# Patient Record
Sex: Female | Born: 1960 | Race: White | Hispanic: No | Marital: Married | Smoking: Never smoker
Health system: Southern US, Community
[De-identification: ages and names within clinical notes are randomized; demographics above are authoritative.]

## PROBLEM LIST (undated history)

## (undated) DIAGNOSIS — F32A Depression, unspecified: Secondary | ICD-10-CM

## (undated) DIAGNOSIS — H269 Unspecified cataract: Secondary | ICD-10-CM

## (undated) DIAGNOSIS — I10 Essential (primary) hypertension: Secondary | ICD-10-CM

## (undated) DIAGNOSIS — E079 Disorder of thyroid, unspecified: Secondary | ICD-10-CM

## (undated) DIAGNOSIS — M1990 Unspecified osteoarthritis, unspecified site: Secondary | ICD-10-CM

## (undated) DIAGNOSIS — T7840XA Allergy, unspecified, initial encounter: Secondary | ICD-10-CM

## (undated) DIAGNOSIS — G4730 Sleep apnea, unspecified: Secondary | ICD-10-CM

## (undated) DIAGNOSIS — K219 Gastro-esophageal reflux disease without esophagitis: Secondary | ICD-10-CM

## (undated) DIAGNOSIS — E785 Hyperlipidemia, unspecified: Secondary | ICD-10-CM

## (undated) DIAGNOSIS — D649 Anemia, unspecified: Secondary | ICD-10-CM

## (undated) DIAGNOSIS — M5030 Other cervical disc degeneration, unspecified cervical region: Secondary | ICD-10-CM

## (undated) HISTORY — DX: Unspecified cataract: H26.9

## (undated) HISTORY — DX: Anemia, unspecified: D64.9

## (undated) HISTORY — DX: Allergy, unspecified, initial encounter: T78.40XA

## (undated) HISTORY — DX: Unspecified osteoarthritis, unspecified site: M19.90

## (undated) HISTORY — PX: WISDOM TOOTH EXTRACTION: SHX21

## (undated) HISTORY — DX: Disorder of thyroid, unspecified: E07.9

## (undated) HISTORY — DX: Sleep apnea, unspecified: G47.30

## (undated) HISTORY — DX: Gastro-esophageal reflux disease without esophagitis: K21.9

## (undated) HISTORY — DX: Other cervical disc degeneration, unspecified cervical region: M50.30

## (undated) HISTORY — DX: Hyperlipidemia, unspecified: E78.5

## (undated) HISTORY — PX: OTHER SURGICAL HISTORY: SHX169

## (undated) HISTORY — DX: Depression, unspecified: F32.A

---

## 1984-03-10 HISTORY — PX: GYNECOLOGIC CRYOSURGERY: SHX857

## 2012-07-17 DIAGNOSIS — M5030 Other cervical disc degeneration, unspecified cervical region: Secondary | ICD-10-CM | POA: Insufficient documentation

## 2016-10-09 ENCOUNTER — Emergency Department
Admission: EM | Admit: 2016-10-09 | Discharge: 2016-10-10 | Disposition: A | Discharge status: Home / Self Care | Payer: 59 | Attending: Emergency Medicine | Admitting: Emergency Medicine

## 2016-10-09 ENCOUNTER — Encounter: Payer: Self-pay | Admitting: *Deleted

## 2016-10-09 DIAGNOSIS — Y999 Unspecified external cause status: Secondary | ICD-10-CM | POA: Insufficient documentation

## 2016-10-09 DIAGNOSIS — R1011 Right upper quadrant pain: Secondary | ICD-10-CM | POA: Diagnosis not present

## 2016-10-09 DIAGNOSIS — M549 Dorsalgia, unspecified: Secondary | ICD-10-CM | POA: Insufficient documentation

## 2016-10-09 DIAGNOSIS — S0992XA Unspecified injury of nose, initial encounter: Secondary | ICD-10-CM | POA: Diagnosis present

## 2016-10-09 DIAGNOSIS — Y929 Unspecified place or not applicable: Secondary | ICD-10-CM | POA: Insufficient documentation

## 2016-10-09 DIAGNOSIS — Y939 Activity, unspecified: Secondary | ICD-10-CM | POA: Insufficient documentation

## 2016-10-09 DIAGNOSIS — S022XXA Fracture of nasal bones, initial encounter for closed fracture: Secondary | ICD-10-CM

## 2016-10-09 HISTORY — DX: Essential (primary) hypertension: I10
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
## 2016-10-10 ENCOUNTER — Emergency Department: Payer: 59

## 2016-10-10 ENCOUNTER — Encounter: Payer: Self-pay | Admitting: Radiology

## 2016-10-10 DIAGNOSIS — S022XXA Fracture of nasal bones, initial encounter for closed fracture: Secondary | ICD-10-CM | POA: Diagnosis not present

## 2016-10-10 LAB — COMPREHENSIVE METABOLIC PANEL
ALK PHOS: 90 U/L (ref 38–126)
ALT: 25 U/L (ref 14–54)
ANION GAP: 8 (ref 5–15)
AST: 29 U/L (ref 15–41)
Albumin: 3.9 g/dL (ref 3.5–5.0)
BILIRUBIN TOTAL: 0.3 mg/dL (ref 0.3–1.2)
BUN: 22 mg/dL — ABNORMAL HIGH (ref 6–20)
CALCIUM: 9 mg/dL (ref 8.9–10.3)
CO2: 28 mmol/L (ref 22–32)
Chloride: 101 mmol/L (ref 101–111)
Creatinine, Ser: 1.02 mg/dL — ABNORMAL HIGH (ref 0.44–1.00)
GFR calc non Af Amer: >60 mL/min (ref >60)
Glucose, Bld: 104 mg/dL — ABNORMAL HIGH (ref 65–99)
Potassium: 3.4 mmol/L — ABNORMAL LOW (ref 3.5–5.1)
Sodium: 137 mmol/L (ref 135–145)
TOTAL PROTEIN: 7.5 g/dL (ref 6.5–8.1)

## 2016-10-10 LAB — CBC
HCT: 35.6 % (ref 35.0–47.0)
HEMOGLOBIN: 12.2 g/dL (ref 12.0–16.0)
MCH: 29.8 pg (ref 26.0–34.0)
MCHC: 34.2 g/dL (ref 32.0–36.0)
MCV: 87.1 fL (ref 80.0–100.0)
PLATELETS: 251 10*3/uL (ref 150–440)
RBC: 4.09 MIL/uL (ref 3.80–5.20)
RDW: 12.9 % (ref 11.5–14.5)
WBC: 12.2 10*3/uL — ABNORMAL HIGH (ref 3.6–11.0)

## 2016-10-10 MED ORDER — IOPAMIDOL (ISOVUE-300) INJECTION 61%
100.0000 mL | Freq: Once | INTRAVENOUS | Status: AC | PRN
  Administered 2016-10-10: 100 mL via INTRAVENOUS

## 2016-10-10 MED ORDER — OXYCODONE-ACETAMINOPHEN 5-325 MG PO TABS: 1.0000 | ORAL_TABLET | ORAL | 0 refills | Status: DC | PRN

## 2016-10-10 MED ORDER — OXYCODONE-ACETAMINOPHEN 5-325 MG PO TABS
1.0000 | ORAL_TABLET | Freq: Once | ORAL | Status: AC
  Administered 2016-10-10: 1 via ORAL
  Filled 2016-10-10: qty 1

## 2016-10-10 NOTE — ED Notes (Signed)
MD verbal order to scan pt without blood wok resulting.

## 2016-10-10 NOTE — ED Notes (Signed)
Pt updated on treatment plan. Father at bedside. NAD noted at this time. Bleeding remains under control.

## 2016-10-10 NOTE — ED Notes (Signed)
Patient transported to CT 
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
## 2020-07-12 DIAGNOSIS — Z800 Family history of malignant neoplasm of digestive organs: Secondary | ICD-10-CM | POA: Insufficient documentation

## 2020-09-24 DIAGNOSIS — S83242A Other tear of medial meniscus, current injury, left knee, initial encounter: Secondary | ICD-10-CM | POA: Insufficient documentation

## 2020-12-29 DIAGNOSIS — M1990 Unspecified osteoarthritis, unspecified site: Secondary | ICD-10-CM | POA: Insufficient documentation

## 2021-01-04 DIAGNOSIS — E034 Atrophy of thyroid (acquired): Secondary | ICD-10-CM | POA: Insufficient documentation

## 2021-01-04 DIAGNOSIS — Z803 Family history of malignant neoplasm of breast: Secondary | ICD-10-CM | POA: Insufficient documentation

## 2021-01-04 DIAGNOSIS — K219 Gastro-esophageal reflux disease without esophagitis: Secondary | ICD-10-CM | POA: Insufficient documentation

## 2021-01-04 DIAGNOSIS — J40 Bronchitis, not specified as acute or chronic: Secondary | ICD-10-CM | POA: Insufficient documentation

## 2021-01-04 DIAGNOSIS — N393 Stress incontinence (female) (male): Secondary | ICD-10-CM | POA: Insufficient documentation

## 2021-01-04 DIAGNOSIS — M170 Bilateral primary osteoarthritis of knee: Secondary | ICD-10-CM | POA: Insufficient documentation

## 2021-01-04 DIAGNOSIS — F330 Major depressive disorder, recurrent, mild: Secondary | ICD-10-CM | POA: Insufficient documentation

## 2021-09-22 ENCOUNTER — Encounter: Payer: Self-pay | Admitting: Internal Medicine

## 2021-10-15 ENCOUNTER — Other Ambulatory Visit: Payer: Self-pay

## 2021-10-15 MED ORDER — DULOXETINE HCL 60 MG PO CPEP
ORAL_CAPSULE | ORAL | 0 refills | Status: DC
  Filled 2021-10-15: qty 90, 90d supply, fill #0

## 2021-10-22 ENCOUNTER — Ambulatory Visit: Payer: 59 | Admitting: Internal Medicine

## 2021-10-29 ENCOUNTER — Ambulatory Visit: Payer: 59 | Admitting: Internal Medicine

## 2021-11-07 ENCOUNTER — Ambulatory Visit (INDEPENDENT_AMBULATORY_CARE_PROVIDER_SITE_OTHER): Payer: 59 | Admitting: Nurse Practitioner

## 2021-11-07 ENCOUNTER — Encounter: Payer: Self-pay | Admitting: Nurse Practitioner

## 2021-11-07 VITALS — BP 99/64 | HR 71 | Ht 60.0 in | Wt 208.2 lb

## 2021-11-07 DIAGNOSIS — F32A Depression, unspecified: Secondary | ICD-10-CM | POA: Diagnosis not present

## 2021-11-07 DIAGNOSIS — I10 Essential (primary) hypertension: Secondary | ICD-10-CM

## 2021-11-07 DIAGNOSIS — E039 Hypothyroidism, unspecified: Secondary | ICD-10-CM | POA: Diagnosis not present

## 2021-11-07 DIAGNOSIS — G4730 Sleep apnea, unspecified: Secondary | ICD-10-CM | POA: Diagnosis not present

## 2021-11-07 DIAGNOSIS — Z0000 Encounter for general adult medical examination without abnormal findings: Secondary | ICD-10-CM | POA: Diagnosis not present

## 2021-11-07 MED ORDER — LEVOTHYROXINE SODIUM 25 MCG PO TABS: 25.0000 ug | ORAL_TABLET | Freq: Every day | ORAL | 2 refills | Status: DC

## 2021-11-07 MED ORDER — LISINOPRIL-HYDROCHLOROTHIAZIDE 20-12.5 MG PO TABS
1.0000 | ORAL_TABLET | Freq: Every day | ORAL | 2 refills | Status: DC
  Filled 2021-12-11: qty 90, 90d supply, fill #0
  Filled 2022-03-17: qty 90, 90d supply, fill #1
  Filled 2022-06-09: qty 60, 60d supply, fill #2

## 2021-11-07 NOTE — Assessment & Plan Note (Signed)
Body mass index is 40.67 kg/m. Advised pt to lose weight. Advised patient to avoid trans fat, fatty and fried food. Follow a regular physical activity schedule.

## 2021-11-07 NOTE — Assessment & Plan Note (Signed)
Patient blood pressure controlled. Advised patient to eat low-salt heart healthy diet. Encourage patient to continue lisinopril HCTZ.

## 2021-11-07 NOTE — Assessment & Plan Note (Signed)
Patient have history of sleep apnea and is using CPAP machine from last 10 years. Patient would like to get a sleep study done.

## 2021-11-07 NOTE — Progress Notes (Signed)
New Patient Office Visit  Subjective    Patient ID: Alicia Mcgrath, female    DOB: [birth year]  Age: 61 y.o. MRN: [id number]  CC: No chief complaint on file.   HPI Alicia Mcgrath presents to establish care and annual physical.. She has h/o hypertension, hypothyroidism, GERD, DDD cervical depression.   Flu: 2022 Tetanus:needs to update  COVID: pfizerx2 Pap smear: due Dentist: due  Mammogram: due Eye examination: due  Exercise: intermittent 15 mints walking   Diet: Patient does eat meat. Patient consumes fruits and veggies. Patient eat some fried food. Patient drinks water, coffee and tea.    Outpatient Encounter Medications as of 11/07/2021  Medication Sig   levothyroxine (SYNTHROID) 25 MCG tablet Take 1 tablet (25 mcg total) by mouth daily.   lisinopril-hydrochlorothiazide (ZESTORETIC) 20-12.5 MG tablet Take 1 tablet by mouth daily.   DULoxetine (CYMBALTA) 60 MG capsule Take 1 capsule every day by oral route.   oxyCODONE-acetaminophen (ROXICET) 5-325 MG tablet Take 1 tablet by mouth every 4 (four) hours as needed for severe pain.   No facility-administered encounter medications on file as of 11/07/2021.    Past Medical History:  Diagnosis Date   Hypertension     History reviewed. No pertinent surgical history.  History reviewed. No pertinent family history.  Social History   Socioeconomic History   Marital status: Married    Spouse name: Not on file   Number of children: Not on file   Years of education: Not on file   Highest education level: Not on file  Occupational History   Not on file  Tobacco Use   Smoking status: Never   Smokeless tobacco: Never  Substance and Sexual Activity   Alcohol use: No   Drug use: No   Sexual activity: Not on file  Other Topics Concern   Not on file  Social History Narrative   Not on file   Social Determinants of Health   Financial Resource Strain: Not on file  Food Insecurity: Not on file  Transportation Needs:  Not on file  Physical Activity: Not on file  Stress: Not on file  Social Connections: Not on file  Intimate Partner Violence: Not on file    Review of Systems  Constitutional: Negative.   HENT: Negative.    Eyes: Negative.   Respiratory:  Negative for cough and shortness of breath.   Cardiovascular:  Negative for chest pain and leg swelling.  Genitourinary:        Stress incontinence   Musculoskeletal:  Positive for joint pain and neck pain.  Skin: Negative.   Neurological:  Positive for tingling. Negative for dizziness and headaches.  Psychiatric/Behavioral:  Negative for depression, substance abuse and suicidal ideas.         Objective    BP 99/64   Pulse 71   Ht 5' (1.524 m)   Wt 208 lb 4 oz (94.5 kg)   BMI 40.67 kg/m   Physical Exam Constitutional:      Appearance: Normal appearance. She is obese.  HENT:     Right Ear: Tympanic membrane normal.     Left Ear: Tympanic membrane normal.     Nose: Nose normal.  Eyes:     Extraocular Movements: Extraocular movements intact.     Conjunctiva/sclera: Conjunctivae normal.     Pupils: Pupils are equal, round, and reactive to light.  Cardiovascular:     Rate and Rhythm: Normal rate and regular rhythm.     Pulses: Normal pulses.  Heart sounds: Normal heart sounds.  Pulmonary:     Effort: Pulmonary effort is normal.     Breath sounds: Normal breath sounds.  Abdominal:     General: Bowel sounds are normal.     Palpations: Abdomen is soft. There is no mass.     Tenderness: There is no abdominal tenderness.     Hernia: No hernia is present.  Skin:    General: Skin is warm.     Capillary Refill: Capillary refill takes less than 2 seconds.  Neurological:     General: No focal deficit present.     Mental Status: She is alert and oriented to person, place, and time. Mental status is at baseline.  Psychiatric:        Mood and Affect: Mood normal.        Behavior: Behavior normal.        Thought Content: Thought  content normal.        Judgment: Judgment normal.         Assessment & Plan:   Problem List Items Addressed This Visit       Cardiovascular and Mediastinum   Hypertension    Patient blood pressure controlled. Advised patient to eat low-salt heart healthy diet. Encourage patient to continue lisinopril HCTZ.      Relevant Medications   lisinopril-hydrochlorothiazide (ZESTORETIC) 20-12.5 MG tablet     Respiratory   Sleep apnea    Patient have history of sleep apnea and is using CPAP machine from last 10 years. Patient would like to get a sleep study done.        Endocrine   Hypothyroidism    Patient is taking levothyroxine 25 mcg daily. Continue levothyroxine.       Relevant Medications   levothyroxine (SYNTHROID) 25 MCG tablet     Other   Annual physical exam - Primary    Encouraged patient to consume a balanced diet and regular exercise regimen. Advised to see an eye doctor and dentist annually.  Referral sent for mammogram and colonoscopy.      Depression    Stable with medication. Continue Cymbalta 60 mg daily.      Morbid obesity (HCC)    Body mass index is 40.67 kg/m. Advised pt to lose weight. Advised patient to avoid trans fat, fatty and fried food. Follow a regular physical activity schedule.           Return in about 1 month (around 12/07/2021).   Alicia Dies, NP

## 2021-11-07 NOTE — Assessment & Plan Note (Signed)
Patient is taking levothyroxine 25 mcg daily. Continue levothyroxine.

## 2021-11-07 NOTE — Assessment & Plan Note (Signed)
Stable with medication. Continue Cymbalta 60 mg daily.

## 2021-11-07 NOTE — Assessment & Plan Note (Signed)
Encouraged patient to consume a balanced diet and regular exercise regimen. Advised to see an eye doctor and dentist annually.  Referral sent for mammogram and colonoscopy.

## 2021-12-11 ENCOUNTER — Other Ambulatory Visit: Payer: Self-pay

## 2021-12-11 MED ORDER — LEVOTHYROXINE SODIUM 25 MCG PO TABS
25.0000 ug | ORAL_TABLET | Freq: Every day | ORAL | 1 refills | Status: DC
  Filled 2021-12-11: qty 90, 90d supply, fill #0

## 2021-12-12 ENCOUNTER — Other Ambulatory Visit: Payer: Self-pay

## 2021-12-17 ENCOUNTER — Other Ambulatory Visit: Payer: Self-pay

## 2021-12-17 MED ORDER — DULOXETINE HCL 20 MG PO CPEP
40.0000 mg | ORAL_CAPSULE | Freq: Every day | ORAL | 0 refills | Status: DC
  Filled 2021-12-17: qty 180, 90d supply, fill #0

## 2021-12-22 ENCOUNTER — Encounter: Payer: Self-pay | Admitting: Internal Medicine

## 2022-01-22 ENCOUNTER — Ambulatory Visit: Payer: 59 | Admitting: Internal Medicine

## 2022-04-10 ENCOUNTER — Other Ambulatory Visit: Payer: Self-pay

## 2022-04-10 ENCOUNTER — Ambulatory Visit: Payer: Commercial Managed Care - PPO | Admitting: Family Medicine

## 2022-04-10 ENCOUNTER — Encounter: Payer: Self-pay | Admitting: Family Medicine

## 2022-04-10 VITALS — BP 115/80 | HR 65 | Temp 97.7°F | Ht 61.0 in | Wt 204.0 lb

## 2022-04-10 DIAGNOSIS — E66812 Obesity, class 2: Secondary | ICD-10-CM

## 2022-04-10 DIAGNOSIS — G4733 Obstructive sleep apnea (adult) (pediatric): Secondary | ICD-10-CM | POA: Diagnosis not present

## 2022-04-10 DIAGNOSIS — Z1231 Encounter for screening mammogram for malignant neoplasm of breast: Secondary | ICD-10-CM

## 2022-04-10 DIAGNOSIS — Z23 Encounter for immunization: Secondary | ICD-10-CM

## 2022-04-10 DIAGNOSIS — Z1159 Encounter for screening for other viral diseases: Secondary | ICD-10-CM | POA: Diagnosis not present

## 2022-04-10 DIAGNOSIS — Z1211 Encounter for screening for malignant neoplasm of colon: Secondary | ICD-10-CM

## 2022-04-10 DIAGNOSIS — E038 Other specified hypothyroidism: Secondary | ICD-10-CM | POA: Diagnosis not present

## 2022-04-10 DIAGNOSIS — I10 Essential (primary) hypertension: Secondary | ICD-10-CM | POA: Diagnosis not present

## 2022-04-10 DIAGNOSIS — Z6838 Body mass index (BMI) 38.0-38.9, adult: Secondary | ICD-10-CM | POA: Diagnosis not present

## 2022-04-10 DIAGNOSIS — Z7689 Persons encountering health services in other specified circumstances: Secondary | ICD-10-CM | POA: Insufficient documentation

## 2022-04-10 LAB — CBC WITH DIFFERENTIAL/PLATELET
Basophils Absolute: 107 cells/uL (ref 0–200)
Eosinophils Absolute: 287 cells/uL (ref 15–500)
Eosinophils Relative: 3.5 %
HCT: 38.3 % (ref 35.0–45.0)
MPV: 9.6 fL (ref 7.5–12.5)
Monocytes Relative: 4.4 %
Neutrophils Relative %: 58.8 %
Platelets: 261 10*3/uL (ref 140–400)
WBC: 8.2 10*3/uL (ref 3.8–10.8)

## 2022-04-10 MED ORDER — LEVOTHYROXINE SODIUM 25 MCG PO TABS
25.0000 ug | ORAL_TABLET | Freq: Every day | ORAL | 1 refills | Status: DC
  Filled 2022-04-10: qty 90, 90d supply, fill #0
  Filled 2022-07-04 – 2022-07-08 (×2): qty 90, 90d supply, fill #1

## 2022-04-10 NOTE — Assessment & Plan Note (Signed)
Chronic. Well controlled on current regimen. Will continue Zestoretic 20-12.5mg  daily. Seek medical care for chest pain, palpitations, shortness of breath, lightheadedness/dizziness, swelling of extremities.

## 2022-04-10 NOTE — Assessment & Plan Note (Signed)
Medical history reviewed today. She will return to office in August for annual physical. Fasting labs drawn today. She has no concerns today and her chronic conditions are currently well controlled. Will continue medications and refill Synthroid as requested. Referral place for CPAP titration. Health maintenance items reviewed and orders placed for mammogram, colonoscopy. TDap done today. She is working on weight loss currently with diet and exercise.

## 2022-04-10 NOTE — Progress Notes (Signed)
New Patient Office Visit  Subjective    Patient ID: Alicia Mcgrath, female    DOB: 19-Dec-1960  Age: 62 y.o. MRN: [id number]  CC: No chief complaint on file.   HPI [name] presents to establish care. Oriented to practice routines and expectations. Her most recent physical was in August 2023 without labs. She has a PMH of OSA and was seeing a pulmonologist for this, depression, hypothyroidism, HTN, GERD, DDD- all well controlled on current regimen. She requests refill on her Synthroid and CPAP titration. No concerns today. Limited medical records available for review.  Cervical cancer screening: due, will complete with physical in August Breast cancer screening: due Colon cancer screening: colonoscopy q5y, due Vaccines: Tdap today Tobacco/ETOH: denies BMI 38.55   Outpatient Encounter Medications as of 04/10/2022  Medication Sig   DULoxetine (CYMBALTA) 20 MG capsule Take 2 capsules (40 mg total) by mouth daily.   lisinopril-hydrochlorothiazide (ZESTORETIC) 20-12.5 MG tablet Take 1 tablet by mouth daily.   [DISCONTINUED] DULoxetine (CYMBALTA) 60 MG capsule Take 1 capsule every day by oral route.   [DISCONTINUED] levothyroxine (SYNTHROID) 25 MCG tablet Take 1 tablet (25 mcg total) by mouth daily.   levothyroxine (SYNTHROID) 25 MCG tablet Take 1 tablet (25 mcg total) by mouth daily.   [DISCONTINUED] levothyroxine (SYNTHROID) 25 MCG tablet Take 1 tablet (25 mcg total) by mouth daily.   No facility-administered encounter medications on file as of 04/10/2022.    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    Family History  Problem Relation Age of Onset   Cancer Mother    Heart disease  Father    Stroke Father    Cancer Father    Stroke Maternal Grandmother    Diabetes Maternal Grandmother    Cancer Maternal Grandfather    Diabetes Maternal Grandfather    Diabetes Paternal Grandfather    Alcohol abuse Paternal Grandfather     Social History   Socioeconomic History   Marital status: Married    Spouse name: Not on file   Number of children: Not on file   Years of education: Not on file   Highest education level: Not on file  Occupational History   Not on file  Tobacco Use   Smoking status: Never   Smokeless tobacco: Never  Substance and Sexual Activity   Alcohol use: No   Drug use: No   Sexual activity: Not Currently  Other Topics Concern   Not on file  Social History Narrative   Not on file   Social Determinants of Health   Financial Resource Strain: Not on file  Food Insecurity: Not on file  Transportation Needs: Not on file  Physical Activity: Not on file  Stress: Not on file  Social Connections: Not on file  Intimate Partner Violence: Not on file    Review of Systems  Constitutional: Negative.   HENT: Negative.    Eyes: Negative.   Respiratory: Negative.    Cardiovascular: Negative.   Gastrointestinal: Negative.   Genitourinary: Negative.   Musculoskeletal: Negative.   Skin: Negative.   Neurological: Negative.   Endo/Heme/Allergies: Negative.   Psychiatric/Behavioral: Negative.  All other systems reviewed and are negative.       Objective    BP 115/80   Pulse 65   Temp 97.7 F (36.5 C) (Oral)   Ht 5\' 1"  (1.549 m)   Wt 204 lb (92.5 kg)   SpO2 100%   BMI 38.55 kg/m   Physical Exam Vitals and nursing note reviewed.  Constitutional:      Appearance: Normal appearance. She is obese.  HENT:     Head: Normocephalic and atraumatic.     Right Ear: Tympanic membrane, ear canal and external ear normal.     Left Ear: Tympanic membrane, ear canal and external ear normal.     Nose: Nose normal.     Mouth/Throat:     Mouth:  Mucous membranes are moist.     Pharynx: Oropharynx is clear.  Eyes:     Extraocular Movements: Extraocular movements intact.     Conjunctiva/sclera: Conjunctivae normal.     Pupils: Pupils are equal, round, and reactive to light.  Cardiovascular:     Rate and Rhythm: Normal rate and regular rhythm.     Pulses: Normal pulses.     Heart sounds: Normal heart sounds.  Pulmonary:     Effort: Pulmonary effort is normal.     Breath sounds: Normal breath sounds.  Abdominal:     General: Bowel sounds are normal.     Palpations: Abdomen is soft.  Musculoskeletal:        General: Normal range of motion.     Cervical back: Normal range of motion and neck supple.  Skin:    General: Skin is warm and dry.     Capillary Refill: Capillary refill takes less than 2 seconds.  Neurological:     General: No focal deficit present.     Mental Status: She is alert and oriented to person, place, and time. Mental status is at baseline.  Psychiatric:        Mood and Affect: Mood normal.        Behavior: Behavior normal.        Thought Content: Thought content normal.        Judgment: Judgment normal.         Assessment & Plan:   Problem List Items Addressed This Visit       Cardiovascular and Mediastinum   Hypertension    Chronic. Well controlled on current regimen. Will continue Zestoretic 20-12.5mg  daily. Seek medical care for chest pain, palpitations, shortness of breath, lightheadedness/dizziness, swelling of extremities.      Relevant Orders   CBC with Differential/Platelet   COMPLETE METABOLIC PANEL WITH GFR   Lipid panel     Respiratory   Sleep apnea   Relevant Orders   Cpap titration     Endocrine   Subclinical hypothyroidism   Relevant Medications   levothyroxine (SYNTHROID) 25 MCG tablet   Other Relevant Orders   TSH     Other   Encounter to establish care with new doctor - Primary    Medical history reviewed today. She will return to office in August for annual  physical. Fasting labs drawn today. She has no concerns today and her chronic conditions are currently well controlled. Will continue medications and refill Synthroid as requested. Referral place for CPAP titration. Health maintenance items reviewed and orders placed for mammogram, colonoscopy. TDap done today. She is working on weight loss currently with diet and exercise.      Other Visit Diagnoses  Encounter for screening mammogram for malignant neoplasm of breast       Relevant Orders   MM DIGITAL SCREENING BILATERAL   Colon cancer screening       Relevant Orders   Ambulatory referral to Gastroenterology   Need for hepatitis C screening test       Relevant Orders   Hepatitis C antibody   Class 2 severe obesity with serious comorbidity and body mass index (BMI) of 38.0 to 38.9 in adult, unspecified obesity type ([place])           Return for annual physical.   Rubie Maid, FNP

## 2022-04-11 ENCOUNTER — Other Ambulatory Visit (INDEPENDENT_AMBULATORY_CARE_PROVIDER_SITE_OTHER): Payer: Commercial Managed Care - PPO

## 2022-04-11 DIAGNOSIS — Z23 Encounter for immunization: Secondary | ICD-10-CM

## 2022-04-11 LAB — LIPID PANEL
Cholesterol: 184 mg/dL (ref <200)
HDL: 52 mg/dL (ref >=50)
LDL Cholesterol (Calc): 92 mg/dL (calc)
Non-HDL Cholesterol (Calc): 132 mg/dL (calc) — ABNORMAL HIGH (ref <130)
Total CHOL/HDL Ratio: 3.5 (calc) (ref <5.0)
Triglycerides: 277 mg/dL — ABNORMAL HIGH (ref <150)

## 2022-04-11 LAB — CBC WITH DIFFERENTIAL/PLATELET
Absolute Monocytes: 361 cells/uL (ref 200–950)
Basophils Relative: 1.3 %
Hemoglobin: 13.3 g/dL (ref 11.7–15.5)
Lymphs Abs: 2624 cells/uL (ref 850–3900)
MCH: 31.1 pg (ref 27.0–33.0)
MCHC: 34.7 g/dL (ref 32.0–36.0)
MCV: 89.7 fL (ref 80.0–100.0)
Neutro Abs: 4822 cells/uL (ref 1500–7800)
RBC: 4.27 10*6/uL (ref 3.80–5.10)
RDW: 12.3 % (ref 11.0–15.0)
Total Lymphocyte: 32 %

## 2022-04-11 LAB — COMPLETE METABOLIC PANEL WITH GFR
AG Ratio: 1.4 (calc) (ref 1.0–2.5)
ALT: 29 U/L (ref 6–29)
AST: 26 U/L (ref 10–35)
Albumin: 4.2 g/dL (ref 3.6–5.1)
Alkaline phosphatase (APISO): 77 U/L (ref 37–153)
BUN: 18 mg/dL (ref 7–25)
CO2: 32 mmol/L (ref 20–32)
Calcium: 9.7 mg/dL (ref 8.6–10.4)
Chloride: 102 mmol/L (ref 98–110)
Creat: 0.93 mg/dL (ref 0.50–1.05)
Globulin: 2.9 g/dL (calc) (ref 1.9–3.7)
Glucose, Bld: 92 mg/dL (ref 65–99)
Potassium: 4.2 mmol/L (ref 3.5–5.3)
Sodium: 141 mmol/L (ref 135–146)
Total Bilirubin: 0.3 mg/dL (ref 0.2–1.2)
Total Protein: 7.1 g/dL (ref 6.1–8.1)
eGFR: 70 mL/min/{1.73_m2} (ref >=60)

## 2022-04-11 LAB — HEPATITIS C ANTIBODY: Hepatitis C Ab: NONREACTIVE

## 2022-04-11 LAB — TSH: TSH: 3.08 mIU/L (ref 0.40–4.50)

## 2022-04-15 ENCOUNTER — Encounter: Payer: Self-pay | Admitting: Gastroenterology

## 2022-04-16 ENCOUNTER — Other Ambulatory Visit: Payer: Self-pay

## 2022-04-17 ENCOUNTER — Other Ambulatory Visit: Payer: Self-pay

## 2022-04-18 ENCOUNTER — Other Ambulatory Visit: Payer: Self-pay

## 2022-04-22 ENCOUNTER — Encounter: Payer: Self-pay | Admitting: Family Medicine

## 2022-04-23 ENCOUNTER — Other Ambulatory Visit: Payer: Self-pay

## 2022-04-23 ENCOUNTER — Other Ambulatory Visit: Payer: Self-pay | Admitting: Family Medicine

## 2022-04-23 MED ORDER — DULOXETINE HCL 20 MG PO CPEP
40.0000 mg | ORAL_CAPSULE | Freq: Every day | ORAL | 0 refills | Status: DC
  Filled 2022-04-23: qty 180, 90d supply, fill #0

## 2022-05-07 ENCOUNTER — Ambulatory Visit (AMBULATORY_SURGERY_CENTER): Payer: Commercial Managed Care - PPO | Admitting: *Deleted

## 2022-05-07 ENCOUNTER — Other Ambulatory Visit: Payer: Self-pay

## 2022-05-07 VITALS — Ht 60.0 in | Wt 194.0 lb

## 2022-05-07 DIAGNOSIS — Z1211 Encounter for screening for malignant neoplasm of colon: Secondary | ICD-10-CM

## 2022-05-07 MED ORDER — NA SULFATE-K SULFATE-MG SULF 17.5-3.13-1.6 GM/177ML PO SOLN
1.0000 | Freq: Once | ORAL | 0 refills | Status: AC
  Filled 2022-05-07: qty 354, 1d supply, fill #0

## 2022-05-07 NOTE — Progress Notes (Signed)
No egg or soy allergy known to patient  No issues known to pt with past sedation with any surgeries or procedures Patient denies ever being told they had issues or difficulty with intubation  No FH of Malignant Hyperthermia Pt is not on diet pills Pt is not on  home 02  Pt is not on blood thinners  Pt denies issues with constipation  No A fib or A flutter Have any cardiac testing pending--no Pt instructed to use Singlecare.com or GoodRx for a price reduction on prep  Patient's chart reviewed by Alicia Mcgrath CNRA prior to previsit and patient appropriate for the [facility].  Previsit completed and red dot placed by patient's name on their procedure day (on provider's schedule).    

## 2022-05-08 ENCOUNTER — Encounter: Payer: Self-pay | Admitting: Gastroenterology

## 2022-05-28 ENCOUNTER — Ambulatory Visit (AMBULATORY_SURGERY_CENTER): Payer: Commercial Managed Care - PPO | Admitting: Gastroenterology

## 2022-05-28 ENCOUNTER — Encounter: Payer: Self-pay | Admitting: Gastroenterology

## 2022-05-28 VITALS — BP 97/57 | HR 59 | Temp 97.3°F | Resp 12 | Ht 60.0 in | Wt 194.0 lb

## 2022-05-28 DIAGNOSIS — Z1211 Encounter for screening for malignant neoplasm of colon: Secondary | ICD-10-CM | POA: Diagnosis not present

## 2022-05-28 DIAGNOSIS — D120 Benign neoplasm of cecum: Secondary | ICD-10-CM | POA: Diagnosis not present

## 2022-05-28 MED ORDER — SODIUM CHLORIDE 0.9 % IV SOLN: 500.0000 mL | Freq: Once | INTRAVENOUS | Status: DC

## 2022-05-28 NOTE — Progress Notes (Signed)
Uneventful anesthetic. Report to pacu rn. Vss. Care resumed by rn. 

## 2022-05-28 NOTE — Patient Instructions (Signed)
Handouts Provided:  Polyps  YOU HAD AN ENDOSCOPIC PROCEDURE TODAY AT THE [facility]:   Refer to the procedure report that was given to you for any specific questions about what was found during the examination.  If the procedure report does not answer your questions, please call your gastroenterologist to clarify.  If you requested that your care partner not be given the details of your procedure findings, then the procedure report has been included in a sealed envelope for you to review at your convenience later.  YOU SHOULD EXPECT: Some feelings of bloating in the abdomen. Passage of more gas than usual.  Walking can help get rid of the air that was put into your GI tract during the procedure and reduce the bloating. If you had a lower endoscopy (such as a colonoscopy or flexible sigmoidoscopy) you may notice spotting of blood in your stool or on the toilet paper. If you underwent a bowel prep for your procedure, you may not have a normal bowel movement for a few days.  Please Note:  You might notice some irritation and congestion in your nose or some drainage.  This is from the oxygen used during your procedure.  There is no need for concern and it should clear up in a day or so.  SYMPTOMS TO REPORT IMMEDIATELY:  Following lower endoscopy (colonoscopy or flexible sigmoidoscopy):  Excessive amounts of blood in the stool  Significant tenderness or worsening of abdominal pains  Swelling of the abdomen that is new, acute  Fever of 100F or higher  For urgent or emergent issues, a gastroenterologist can be reached at any hour by calling [phone]. Do not use MyChart messaging for urgent concerns.    DIET:  We do recommend a small meal at first, but then you may proceed to your regular diet.  Drink plenty of fluids but you should avoid alcoholic beverages for 24 hours.  ACTIVITY:  You should plan to take it easy for the rest of today and you should NOT DRIVE or use heavy  machinery until tomorrow (because of the sedation medicines used during the test).    FOLLOW UP: Our staff will call the number listed on your records the next business day following your procedure.  We will call around 7:15- 8:00 am to check on you and address any questions or concerns that you may have regarding the information given to you following your procedure. If we do not reach you, we will leave a message.     If any biopsies were taken you will be contacted by phone or by letter within the next 1-3 weeks.  Please call us at [phone] if you have not heard about the biopsies in 3 weeks.    SIGNATURES/CONFIDENTIALITY: You and/or your care partner have signed paperwork which will be entered into your electronic medical record.  These signatures attest to the fact that that the information above on your After Visit Summary has been reviewed and is understood.  Full responsibility of the confidentiality of this discharge information lies with you and/or your care-partner.  

## 2022-05-28 NOTE — Op Note (Signed)
[facility] Patient Name: Alicia Mcgrath Procedure Date: 05/28/2022 7:48 AM MRN: [id number] Endoscopist: Gerrit Heck , MD, [id number] Age: 62 Referring MD:  Date of Birth: 10-17-1960 Gender: Female Account #: [id number] Procedure:                Colonoscopy Indications:              Screening for colorectal malignant neoplasm (last                            colonoscopy was 10 years ago) Medicines:                Monitored Anesthesia Care Procedure:                Pre-Anesthesia Assessment:                           - Prior to the procedure, a History and Physical                            was performed, and patient medications and                            allergies were reviewed. The patient's tolerance of                            previous anesthesia was also reviewed. The risks                            and benefits of the procedure and the sedation                            options and risks were discussed with the patient.                            All questions were answered, and informed consent                            was obtained. Prior Anticoagulants: The patient has                            taken no anticoagulant or antiplatelet agents. ASA                            Grade Assessment: II - A patient with mild systemic                            disease. After reviewing the risks and benefits,                            the patient was deemed in satisfactory condition to                            undergo the procedure.  After obtaining informed consent, the colonoscope                            was passed under direct vision. Throughout the                            procedure, the patient's blood pressure, pulse, and                            oxygen saturations were monitored continuously. The                            CF [id number] was introduced through the anus                            and advanced to the the  cecum, identified by                            appendiceal orifice and ileocecal valve. The                            colonoscopy was performed without difficulty. The                            patient tolerated the procedure well. The quality                            of the bowel preparation was good. The ileocecal                            valve, appendiceal orifice, and rectum were                            photographed. Scope In: 8:19:28 AM Scope Out: 8:36:04 AM Scope Withdrawal Time: 0 hours 10 minutes 8 seconds  Total Procedure Duration: 0 hours 16 minutes 36 seconds  Findings:                 The perianal and digital rectal examinations were                            normal.                           A 4 mm polyp was found in the cecum. The polyp was                            sessile. The polyp was removed with a cold snare.                            Resection and retrieval were complete. Estimated                            blood loss was minimal.  The exam was otherwise normal throughout the                            remainder of the colon.                           The retroflexed view of the distal rectum and anal                            verge was normal and showed no anal or rectal                            abnormalities. Complications:            No immediate complications. Estimated Blood Loss:     Estimated blood loss was minimal. Impression:               - One 4 mm polyp in the cecum, removed with a cold                            snare. Resected and retrieved.                           - The remainder of the colon was normal appearing.                           - The distal rectum and anal verge are normal on                            retroflexion view.                           - The GI Genius (intelligent endoscopy module),                            computer-aided polyp detection system powered by AI                             was utilized to detect colorectal polyps through                            enhanced visualization during colonoscopy. Recommendation:           - Patient has a contact number available for                            emergencies. The signs and symptoms of potential                            delayed complications were discussed with the                            patient. Return to normal activities tomorrow.  Written discharge instructions were provided to the                            patient.                           - Resume previous diet.                           - Continue present medications.                           - Await pathology results.                           - Repeat colonoscopy for surveillance based on                            pathology results.                           - Return to GI office PRN. Gerrit Heck, MD 05/28/2022 8:40:30 AM

## 2022-05-28 NOTE — Progress Notes (Signed)
Called to room to assist during endoscopic procedure.  Patient ID and intended procedure confirmed with present staff. Received instructions for my participation in the procedure from the performing physician.  

## 2022-05-28 NOTE — Progress Notes (Signed)
Pt's states no medical or surgical changes since previsit or office visit. 

## 2022-05-28 NOTE — Progress Notes (Signed)
GASTROENTEROLOGY PROCEDURE H&P NOTE   Primary Care Physician: Rubie Maid, FNP    Reason for Procedure:  Colon Cancer screening  Plan:    Colonoscopy  Patient is appropriate for endoscopic procedure(s) in the ambulatory ([facility]) setting.  The nature of the procedure, as well as the risks, benefits, and alternatives were carefully and thoroughly reviewed with the patient. Ample time for discussion and questions allowed. The patient understood, was satisfied, and agreed to proceed.     HPI: Alicia Mcgrath is a 62 y.o. female who presents for colonoscopy for routine Colon Cancer screening.  No active GI symptoms.  No known family history of colon cancer or related malignancy.  Patient is otherwise without complaints or active issues today.  Past Medical History:  Diagnosis Date   Allergy    seasonal   Anemia    Arthritis    Cataract    begining   DDD (degenerative disc disease), cervical    Depression    GERD (gastroesophageal reflux disease)    Hyperlipidemia    Hypertension    Sleep apnea    c pap   Thyroid disease     Past Surgical History:  Procedure Laterality Date   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   neck injections     WISDOM TOOTH EXTRACTION      Prior to Admission medications   Medication Sig Start Date End Date Taking? Authorizing Provider  B Complex Vitamins (B COMPLEX PO) Take by mouth daily. Take one daily   Yes [provider]  DULoxetine (CYMBALTA) 20 MG capsule Take 2 capsules (40 mg total) by mouth daily. 04/23/22  Yes Rubie Maid, FNP  Green Tea, Camellia sinensis, (GREEN TEA EXTRACT PO) Take by mouth daily. Take one daily   Yes [provider]  levothyroxine (SYNTHROID) 25 MCG tablet Take 1 tablet (25 mcg total) by mouth daily. 04/10/22  Yes Howard, Amber S, FNP  lisinopril-hydrochlorothiazide (ZESTORETIC) 20-12.5 MG tablet Take 1 tablet by mouth daily. 11/07/21  Yes Theresia Lo, NP  Multiple  Vitamins-Minerals (MULTIVITAMIN ADULTS PO) daily. 12/04/15  Yes [provider]  Omega-3 Fatty Acids (FISH OIL PO) Take 1,000 mg by mouth daily.   Yes [provider]  Probiotic Product (PROBIOTIC DAILY PO) Take by mouth daily. Take one daily   Yes [provider]    Current Outpatient Medications  Medication Sig Dispense Refill   B Complex Vitamins (B COMPLEX PO) Take by mouth daily. Take one daily     DULoxetine (CYMBALTA) 20 MG capsule Take 2 capsules (40 mg total) by mouth daily. 180 capsule 0   Green Tea, Camellia sinensis, (GREEN TEA EXTRACT PO) Take by mouth daily. Take one daily     levothyroxine (SYNTHROID) 25 MCG tablet Take 1 tablet (25 mcg total) by mouth daily. 90 tablet 1   lisinopril-hydrochlorothiazide (ZESTORETIC) 20-12.5 MG tablet Take 1 tablet by mouth daily. 90 tablet 2   Multiple Vitamins-Minerals (MULTIVITAMIN ADULTS PO) daily.     Omega-3 Fatty Acids (FISH OIL PO) Take 1,000 mg by mouth daily.     Probiotic Product (PROBIOTIC DAILY PO) Take by mouth daily. Take one daily     Current Facility-Administered Medications  Medication Dose Route Frequency Provider Last Rate Last Admin   0.9 %  sodium chloride infusion  500 mL Intravenous Once Taytem Ghattas V, DO        Allergies as of 05/28/2022   (No Known Allergies)  Family History  Problem Relation Age of Onset   Breast cancer Mother    Cancer Mother    Esophageal cancer Father    Colon polyps Father    Heart disease Father    Stroke Father    Cancer Father    Stroke Maternal Grandmother    Diabetes Maternal Grandmother    Cancer Maternal Grandfather    Diabetes Maternal Grandfather    Diabetes Paternal Grandfather    Alcohol abuse Paternal Grandfather    Stomach cancer Other    Crohn's disease Neg Hx    Colon cancer Neg Hx    Rectal cancer Neg Hx    Ulcerative colitis Neg Hx     Social History   Socioeconomic History   Marital status: Married    Spouse name: Not on  file   Number of children: Not on file   Years of education: Not on file   Highest education level: Not on file  Occupational History   Not on file  Tobacco Use   Smoking status: Never   Smokeless tobacco: Never  Vaping Use   Vaping Use: Never used  Substance and Sexual Activity   Alcohol use: Not Currently    Comment: once a year with egg nog   Drug use: No   Sexual activity: Not Currently  Other Topics Concern   Not on file  Social History Narrative   Not on file   Social Determinants of Health   Financial Resource Strain: Not on file  Food Insecurity: Not on file  Transportation Needs: Not on file  Physical Activity: Not on file  Stress: Not on file  Social Connections: Not on file  Intimate Partner Violence: Not on file    Physical Exam: Vital signs in last 24 hours: @BP  (!) 140/68   Pulse 61   Temp (!) 97.3 F (36.3 C)   Ht 5' (1.524 m)   Wt 194 lb (88 kg)   BMI 37.89 kg/m  GEN: NAD EYE: Sclerae anicteric ENT: MMM CV: Non-tachycardic Pulm: CTA b/l GI: Soft, NT/ND NEURO:  Alert & Oriented x 3   Gerrit Heck, DO [facility]   05/28/2022 8:03 AM

## 2022-05-29 ENCOUNTER — Telehealth: Payer: Self-pay

## 2022-05-29 NOTE — Telephone Encounter (Signed)
  Follow up Call-     05/28/2022    7:29 AM  Call back number  Post procedure Call Back phone  # [phone]  Permission to leave phone message Yes     Patient questions:  Do you have a fever, pain , or abdominal swelling? No. Pain Score  0 *  Have you tolerated food without any problems? No.  Have you been able to return to your normal activities? No.  Do you have any questions about your discharge instructions: Diet   No. Medications  No. Follow up visit  No.  Do you have questions or concerns about your Care? No.  Actions: * If pain score is 4 or above: No action needed, pain <4.

## 2022-06-02 ENCOUNTER — Encounter: Payer: Self-pay | Admitting: Gastroenterology

## 2022-06-09 ENCOUNTER — Other Ambulatory Visit: Payer: Self-pay

## 2022-06-17 ENCOUNTER — Ambulatory Visit
Admission: RE | Admit: 2022-06-17 | Discharge: 2022-06-17 | Disposition: A | Discharge status: Home / Self Care | Payer: Commercial Managed Care - PPO | Source: Ambulatory Visit | Attending: Family Medicine | Admitting: Family Medicine

## 2022-06-17 DIAGNOSIS — Z1231 Encounter for screening mammogram for malignant neoplasm of breast: Secondary | ICD-10-CM

## 2022-07-04 ENCOUNTER — Other Ambulatory Visit: Payer: Self-pay

## 2022-07-04 ENCOUNTER — Encounter: Payer: Self-pay | Admitting: Pharmacist

## 2022-07-08 ENCOUNTER — Other Ambulatory Visit: Payer: Self-pay

## 2022-07-21 ENCOUNTER — Other Ambulatory Visit: Payer: Self-pay | Admitting: Family Medicine

## 2022-07-21 ENCOUNTER — Other Ambulatory Visit: Payer: Self-pay

## 2022-07-21 MED ORDER — DULOXETINE HCL 20 MG PO CPEP
40.0000 mg | ORAL_CAPSULE | Freq: Every day | ORAL | 0 refills | Status: DC
  Filled 2022-07-21: qty 180, 90d supply, fill #0

## 2022-08-11 ENCOUNTER — Other Ambulatory Visit: Payer: Self-pay | Admitting: Nurse Practitioner

## 2022-08-12 ENCOUNTER — Other Ambulatory Visit: Payer: Self-pay

## 2022-08-12 ENCOUNTER — Other Ambulatory Visit: Payer: Self-pay | Admitting: Nurse Practitioner

## 2022-08-13 ENCOUNTER — Other Ambulatory Visit: Payer: Self-pay | Admitting: Nurse Practitioner

## 2022-08-14 ENCOUNTER — Other Ambulatory Visit: Payer: Self-pay

## 2022-08-14 ENCOUNTER — Other Ambulatory Visit: Payer: Self-pay | Admitting: Family Medicine

## 2022-08-14 ENCOUNTER — Other Ambulatory Visit: Payer: Self-pay | Admitting: Nurse Practitioner

## 2022-08-14 MED FILL — Lisinopril & Hydrochlorothiazide Tab 20-12.5 MG: ORAL | 90 days supply | Qty: 90 | Fill #0 | Status: AC

## 2022-08-25 ENCOUNTER — Encounter: Payer: Self-pay | Admitting: Family Medicine

## 2022-08-25 ENCOUNTER — Ambulatory Visit: Payer: Commercial Managed Care - PPO | Admitting: Family Medicine

## 2022-08-25 VITALS — BP 115/78 | HR 68 | Temp 97.9°F | Ht 61.0 in | Wt 187.8 lb

## 2022-08-25 DIAGNOSIS — L989 Disorder of the skin and subcutaneous tissue, unspecified: Secondary | ICD-10-CM | POA: Diagnosis not present

## 2022-08-25 NOTE — Assessment & Plan Note (Signed)
Appearance consistent with scar tissue, no warmth, redness, or drainage. Uniform in pink color. Ms Alicia Mcgrath also has scattered bug bites on her back and upper extremities. Encouraged to use bug spray when outside and benadryl cream for itching. Will refer to dermatology for annual exams and skin concerns. Return to office if symptoms persist or worsen  to include warmth, redness, or drainage.

## 2022-08-25 NOTE — Progress Notes (Signed)
Subjective:  HPI: Alicia Mcgrath is a 62 y.o. female presenting on 08/25/2022 for Follow-up (I have a concerning rough lesion on my upper back. It looks like one my husband had. It turned out to be skin cancer, but not melanoma.)   HPI Patient is in today for concerns about a rough lesion on her left shoulder. She reports it has been there for months, it did have a cyst under it that she punctured with a pen, it drained clear fluid. The lesion is described as rough and pink, no drainage, swelling, warmth, or changes in color.    Review of Systems  All other systems reviewed and are negative.   Relevant past medical history reviewed and updated as indicated.   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   Current Outpatient Medications:    B Complex Vitamins (B COMPLEX PO), Take by mouth daily. Take one daily, Disp: , Rfl:    DULoxetine (CYMBALTA) 20 MG capsule, Take 2 capsules (40 mg total) by mouth daily., Disp: 180 capsule, Rfl: 0   Green Tea, Camellia sinensis, (GREEN TEA EXTRACT PO), Take by mouth daily. Take one daily, Disp: , Rfl:    levothyroxine (SYNTHROID) 25 MCG tablet, Take 1 tablet (25 mcg total) by mouth daily., Disp: 90 tablet, Rfl: 1   lisinopril-hydrochlorothiazide (ZESTORETIC) 20-12.5 MG tablet, Take 1 tablet by mouth daily., Disp: 90 tablet, Rfl: 2   Multiple Vitamins-Minerals (MULTIVITAMIN ADULTS PO), daily., Disp: , Rfl:    Omega-3 Fatty Acids (FISH OIL PO), Take 1,000 mg by mouth daily., Disp: ,  Rfl:    Probiotic Product (PROBIOTIC DAILY PO), Take by mouth daily. Take one daily, Disp: , Rfl:   No Known Allergies  Objective:   BP 115/78   Pulse 68   Temp 97.9 F (36.6 C) (Oral)   Ht 5\' 1"  (1.549 m)   Wt 187 lb 12.8 oz (85.2 kg)   LMP  (Approximate)   SpO2 96%   BMI 35.48 kg/m      08/25/2022   10:36 AM 05/28/2022    8:58 AM 05/28/2022    8:48 AM  Vitals with BMI  Height 5\' 1"     Weight 187 lbs 13 oz    BMI 35.5    Systolic 115 97 92  Diastolic 78 57 55  Pulse 68 59 67     Physical Exam Vitals and nursing note reviewed.  Constitutional:      Appearance: Normal appearance. She is normal weight.  HENT:     Head: Normocephalic and atraumatic.  Skin:    General: Skin is warm and dry.     Findings: Lesion present.          Comments: 0.5in pink lesion appears as scar tissue  Neurological:     General: No focal deficit present.     Mental Status: She is alert and  oriented to person, place, and time. Mental status is at baseline.  Psychiatric:        Mood and Affect: Mood normal.        Behavior: Behavior normal.        Thought Content: Thought content normal.        Judgment: Judgment normal.     Assessment & Plan:  Skin lesion of left upper extremity Assessment & Plan: Appearance consistent with scar tissue, no warmth, redness, or drainage. Uniform in pink color. Ms Lovaglio also has scattered bug bites on her back and upper extremities. Encouraged to use bug spray when outside and benadryl cream for itching. Will refer to dermatology for annual exams and skin concerns. Return to office if symptoms persist or worsen  to include warmth, redness, or drainage.  Orders: -     Ambulatory referral to Dermatology     Follow up plan: Return if symptoms worsen or fail to improve.  Park Meo, FNP

## 2022-08-27 ENCOUNTER — Encounter: Payer: Self-pay | Admitting: Family Medicine

## 2022-10-01 ENCOUNTER — Other Ambulatory Visit: Payer: Self-pay | Admitting: Family Medicine

## 2022-10-02 ENCOUNTER — Other Ambulatory Visit: Payer: Self-pay | Admitting: Family Medicine

## 2022-10-02 ENCOUNTER — Other Ambulatory Visit: Payer: Self-pay

## 2022-10-03 ENCOUNTER — Other Ambulatory Visit: Payer: Self-pay | Admitting: Family Medicine

## 2022-10-03 ENCOUNTER — Other Ambulatory Visit: Payer: Self-pay

## 2022-10-03 MED FILL — Levothyroxine Sodium Tab 25 MCG: ORAL | 90 days supply | Qty: 90 | Fill #0 | Status: AC

## 2022-10-03 NOTE — Telephone Encounter (Signed)
Requested Prescriptions  Pending Prescriptions Disp Refills   levothyroxine (SYNTHROID) 25 MCG tablet 90 tablet 1    Sig: Take 1 tablet (25 mcg total) by mouth daily.     Endocrinology:  Hypothyroid Agents Failed - 10/03/2022  4:45 PM      Failed - Valid encounter within last 12 months    Recent Outpatient Visits   None            Passed - TSH in normal range and within 360 days    TSH  Date Value Ref Range Status  04/10/2022 3.08 0.40 - 4.50 mIU/L Final

## 2022-10-17 ENCOUNTER — Other Ambulatory Visit: Payer: Self-pay | Admitting: Family Medicine

## 2022-10-17 ENCOUNTER — Other Ambulatory Visit: Payer: Self-pay

## 2022-10-19 ENCOUNTER — Other Ambulatory Visit: Payer: Self-pay

## 2022-10-20 ENCOUNTER — Other Ambulatory Visit: Payer: Self-pay

## 2022-10-20 MED FILL — Duloxetine HCl Enteric Coated Pellets Cap 20 MG (Base Eq): ORAL | 90 days supply | Qty: 180 | Fill #0 | Status: AC

## 2022-11-12 MED FILL — Lisinopril & Hydrochlorothiazide Tab 20-12.5 MG: ORAL | 90 days supply | Qty: 90 | Fill #1 | Status: AC

## 2022-12-04 ENCOUNTER — Encounter: Payer: Self-pay | Admitting: Family Medicine

## 2022-12-04 ENCOUNTER — Ambulatory Visit: Payer: Commercial Managed Care - PPO

## 2022-12-04 DIAGNOSIS — Z23 Encounter for immunization: Secondary | ICD-10-CM | POA: Diagnosis not present

## 2022-12-12 ENCOUNTER — Other Ambulatory Visit (HOSPITAL_COMMUNITY): Payer: Self-pay

## 2022-12-12 ENCOUNTER — Other Ambulatory Visit: Payer: Self-pay

## 2022-12-12 MED ORDER — RIFAMPIN 300 MG PO CAPS
300.0000 mg | ORAL_CAPSULE | Freq: Two times a day (BID) | ORAL | 0 refills | Status: AC
  Filled 2022-12-12 (×2): qty 42, 21d supply, fill #0

## 2022-12-12 MED ORDER — DOXYCYCLINE HYCLATE 100 MG PO TABS
100.0000 mg | ORAL_TABLET | Freq: Two times a day (BID) | ORAL | 0 refills | Status: AC
  Filled 2022-12-12: qty 42, 21d supply, fill #0

## 2023-01-01 ENCOUNTER — Other Ambulatory Visit: Payer: Self-pay

## 2023-01-01 MED FILL — Levothyroxine Sodium Tab 25 MCG: ORAL | 90 days supply | Qty: 90 | Fill #1 | Status: AC

## 2023-01-13 DIAGNOSIS — E119 Type 2 diabetes mellitus without complications: Secondary | ICD-10-CM | POA: Diagnosis not present

## 2023-01-13 DIAGNOSIS — H18521 Epithelial (juvenile) corneal dystrophy, right eye: Secondary | ICD-10-CM | POA: Diagnosis not present

## 2023-01-13 DIAGNOSIS — H04123 Dry eye syndrome of bilateral lacrimal glands: Secondary | ICD-10-CM | POA: Diagnosis not present

## 2023-01-13 DIAGNOSIS — H18522 Epithelial (juvenile) corneal dystrophy, left eye: Secondary | ICD-10-CM | POA: Diagnosis not present

## 2023-01-14 ENCOUNTER — Other Ambulatory Visit: Payer: Self-pay | Admitting: Family Medicine

## 2023-01-14 ENCOUNTER — Other Ambulatory Visit: Payer: Self-pay

## 2023-01-14 MED ORDER — DULOXETINE HCL 20 MG PO CPEP
40.0000 mg | ORAL_CAPSULE | Freq: Every day | ORAL | 0 refills | Status: DC
  Filled 2023-01-14: qty 180, 90d supply, fill #0

## 2023-01-29 ENCOUNTER — Other Ambulatory Visit: Payer: Commercial Managed Care - PPO

## 2023-01-29 DIAGNOSIS — Z13220 Encounter for screening for lipoid disorders: Secondary | ICD-10-CM

## 2023-01-29 DIAGNOSIS — E039 Hypothyroidism, unspecified: Secondary | ICD-10-CM

## 2023-01-29 DIAGNOSIS — I10 Essential (primary) hypertension: Secondary | ICD-10-CM | POA: Diagnosis not present

## 2023-01-30 LAB — LIPID PANEL
Cholesterol: 268 mg/dL — ABNORMAL HIGH (ref <200)
HDL: 58 mg/dL (ref >=50)
LDL Cholesterol (Calc): 181 mg/dL — ABNORMAL HIGH
Non-HDL Cholesterol (Calc): 210 mg/dL — ABNORMAL HIGH (ref <130)
Total CHOL/HDL Ratio: 4.6 (calc) (ref <5.0)
Triglycerides: 144 mg/dL (ref <150)

## 2023-01-30 LAB — CBC WITH DIFFERENTIAL/PLATELET
Absolute Lymphocytes: 2325 {cells}/uL (ref 850–3900)
Absolute Monocytes: 322 {cells}/uL (ref 200–950)
Basophils Absolute: 68 {cells}/uL (ref 0–200)
Basophils Relative: 1.1 %
Eosinophils Absolute: 248 {cells}/uL (ref 15–500)
Eosinophils Relative: 4 %
HCT: 37.8 % (ref 35.0–45.0)
Hemoglobin: 12.4 g/dL (ref 11.7–15.5)
MCH: 30 pg (ref 27.0–33.0)
MCHC: 32.8 g/dL (ref 32.0–36.0)
MCV: 91.3 fL (ref 80.0–100.0)
MPV: 9.7 fL (ref 7.5–12.5)
Monocytes Relative: 5.2 %
Neutro Abs: 3236 {cells}/uL (ref 1500–7800)
Neutrophils Relative %: 52.2 %
Platelets: 222 10*3/uL (ref 140–400)
RBC: 4.14 10*6/uL (ref 3.80–5.10)
RDW: 12.7 % (ref 11.0–15.0)
Total Lymphocyte: 37.5 %
WBC: 6.2 10*3/uL (ref 3.8–10.8)

## 2023-01-30 LAB — COMPLETE METABOLIC PANEL WITH GFR
AG Ratio: 1.7 (calc) (ref 1.0–2.5)
ALT: 21 U/L (ref 6–29)
AST: 23 U/L (ref 10–35)
Albumin: 4.3 g/dL (ref 3.6–5.1)
Alkaline phosphatase (APISO): 68 U/L (ref 37–153)
BUN: 16 mg/dL (ref 7–25)
CO2: 30 mmol/L (ref 20–32)
Calcium: 9.7 mg/dL (ref 8.6–10.4)
Chloride: 102 mmol/L (ref 98–110)
Creat: 0.95 mg/dL (ref 0.50–1.05)
Globulin: 2.6 g/dL (ref 1.9–3.7)
Glucose, Bld: 89 mg/dL (ref 65–99)
Potassium: 4.3 mmol/L (ref 3.5–5.3)
Sodium: 141 mmol/L (ref 135–146)
Total Bilirubin: 0.3 mg/dL (ref 0.2–1.2)
Total Protein: 6.9 g/dL (ref 6.1–8.1)
eGFR: 69 mL/min/{1.73_m2} (ref >=60)

## 2023-01-30 LAB — TSH: TSH: 1.81 m[IU]/L (ref 0.40–4.50)

## 2023-02-03 ENCOUNTER — Ambulatory Visit (INDEPENDENT_AMBULATORY_CARE_PROVIDER_SITE_OTHER): Payer: Commercial Managed Care - PPO | Admitting: Family Medicine

## 2023-02-03 ENCOUNTER — Encounter: Payer: Self-pay | Admitting: Family Medicine

## 2023-02-03 VITALS — BP 120/80 | HR 60 | Temp 98.1°F | Ht 61.0 in | Wt 178.2 lb

## 2023-02-03 DIAGNOSIS — E66811 Body mass index (BMI) 33.0-33.9, adult: Secondary | ICD-10-CM | POA: Insufficient documentation

## 2023-02-03 DIAGNOSIS — Z124 Encounter for screening for malignant neoplasm of cervix: Secondary | ICD-10-CM | POA: Diagnosis not present

## 2023-02-03 DIAGNOSIS — Z6833 Body mass index (BMI) 33.0-33.9, adult: Secondary | ICD-10-CM | POA: Diagnosis not present

## 2023-02-03 DIAGNOSIS — Z0001 Encounter for general adult medical examination with abnormal findings: Secondary | ICD-10-CM

## 2023-02-03 DIAGNOSIS — Z23 Encounter for immunization: Secondary | ICD-10-CM

## 2023-02-03 DIAGNOSIS — E782 Mixed hyperlipidemia: Secondary | ICD-10-CM | POA: Diagnosis not present

## 2023-02-03 DIAGNOSIS — Z0000 Encounter for general adult medical examination without abnormal findings: Secondary | ICD-10-CM

## 2023-02-03 NOTE — Assessment & Plan Note (Signed)
Continue low calorie, heart healthy diet and moderate intensity exercise 150 minutes weekly. This is 3-5 times weekly for 30-50 minutes each session. Goal should be pace of 3 miles/hours, or walking 1.5 miles in 30 minutes and include strength training.

## 2023-02-03 NOTE — Progress Notes (Signed)
Complete physical exam  Patient: Alicia Mcgrath   DOB: Aug 23, 1960   62 y.o. Female  MRN: [id number]  Subjective:    Chief Complaint  Patient presents with   Annual Exam    Alicia Mcgrath is a 62 y.o. female who presents today for a complete physical exam. She reports consuming a low fat diet. Home exercise routine includes walking 0.5 hrs per day. She generally feels well. She reports sleeping well. She does not have additional problems to discuss today.   The 10-year ASCVD risk score (Arnett DK, et al., 2019) is: 4.7%   Values used to calculate the score:     Age: 62 years     Sex: Female     Is Non-Hispanic African American: No     Diabetic: No     Tobacco smoker: No     Systolic Blood Pressure: 120 mmHg     Is BP treated: Yes     HDL Cholesterol: 58 mg/dL     Total Cholesterol: 268 mg/dL  Most recent fall risk assessment:    02/03/2023    8:25 AM  Fall Risk   Falls in the past year? 0  Number falls in past yr: 0  Injury with Fall? 0     Most recent depression screenings:    02/03/2023    8:25 AM 04/10/2022    8:51 AM  PHQ 2/9 Scores  PHQ - 2 Score 0 0  PHQ- 9 Score 2 2    Vision:Within last year and Dental: No regular dental care   Patient Active Problem List   Diagnosis Date Noted   Cervical cancer screening 02/03/2023   Moderate mixed hyperlipidemia not requiring statin therapy 02/03/2023   Class 1 obesity with serious comorbidity and body mass index (BMI) of 33.0 to 33.9 in adult 02/03/2023   Skin lesion of left upper extremity 08/25/2022   Subclinical hypothyroidism 04/10/2022   Encounter to establish care with new doctor 04/10/2022   Annual physical exam 11/07/2021   Depression 11/07/2021   Hypertension 11/07/2021   Hypothyroidism 11/07/2021   Sleep apnea 11/07/2021   Morbid obesity (HCC) 11/07/2021   Acquired atrophy of thyroid 01/04/2021   Bronchitis 01/04/2021   Family history of malignant neoplasm of breast 01/04/2021   Gastroesophageal  reflux disease 01/04/2021   Mild episode of recurrent major depressive disorder (HCC) 01/04/2021   Primary osteoarthritis of both knees 01/04/2021   Stress incontinence of urine 01/04/2021   Osteoarthritis (arthritis due to wear and tear of joints) 12/29/2020   Acute medial meniscus tear of left knee 09/24/2020   Family history of esophageal cancer 07/12/2020   Degenerative disc disease, cervical 07/17/2012   Past Medical History:  Diagnosis Date   Allergy    seasonal   Anemia    Arthritis    Cataract    begining   DDD (degenerative disc disease), cervical    Depression    GERD (gastroesophageal reflux disease)    Hyperlipidemia    Hypertension    Sleep apnea    c pap   Thyroid disease    Past Surgical History:  Procedure Laterality Date   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   neck injections     WISDOM TOOTH EXTRACTION     Social History   Tobacco Use   Smoking status: Never   Smokeless tobacco: Never  Vaping Use   Vaping status: Never Used  Substance Use Topics   Alcohol use: Not Currently  Comment: once a year with egg nog   Drug use: No   Family History  Problem Relation Age of Onset   Breast cancer Mother    Cancer Mother    Esophageal cancer Father    Colon polyps Father    Heart disease Father    Stroke Father    Cancer Father    Stroke Maternal Grandmother    Diabetes Maternal Grandmother    Cancer Maternal Grandfather    Diabetes Maternal Grandfather    Diabetes Paternal Grandfather    Alcohol abuse Paternal Grandfather    Stomach cancer Other    Crohn's disease Neg Hx    Colon cancer Neg Hx    Rectal cancer Neg Hx    Ulcerative colitis Neg Hx    No Known Allergies    Patient Care Team: Park Meo, FNP as PCP - General (Family Medicine)   Outpatient Medications Prior to Visit  Medication Sig   B Complex Vitamins (B COMPLEX PO) Take by mouth daily. Take one daily   DULoxetine (CYMBALTA) 20 MG capsule Take 2  capsules (40 mg total) by mouth daily.   Green Tea, Camellia sinensis, (GREEN TEA EXTRACT PO) Take by mouth daily. Take one daily   levothyroxine (SYNTHROID) 25 MCG tablet Take 1 tablet (25 mcg total) by mouth daily.   lisinopril-hydrochlorothiazide (ZESTORETIC) 20-12.5 MG tablet Take 1 tablet by mouth daily.   Multiple Vitamins-Minerals (MULTIVITAMIN ADULTS PO) daily.   Omega-3 Fatty Acids (FISH OIL PO) Take 1,000 mg by mouth daily.   Probiotic Product (PROBIOTIC DAILY PO) Take by mouth daily. Take one daily   No facility-administered medications prior to visit.    ROS        Objective:     BP 120/80 (BP Location: Left Arm)   Pulse 60   Temp 98.1 F (36.7 C)   Ht 5\' 1"  (1.549 m)   Wt 178 lb 4 oz (80.9 kg)   SpO2 98%   BMI 33.68 kg/m  BP Readings from Last 3 Encounters:  02/03/23 120/80  12/04/22 100/70  08/25/22 115/78   Wt Readings from Last 3 Encounters:  02/03/23 178 lb 4 oz (80.9 kg)  12/04/22 180 lb (81.6 kg)  08/25/22 187 lb 12.8 oz (85.2 kg)      Physical Exam   BREAST EXAM: breasts appear normal, no suspicious masses, no skin or nipple changes or axillary nodes  PELVIC EXAM: normal external genitalia, vulva, vagina, cervix, uterus and adnexa     Assessment & Plan:    Routine Health Maintenance and Physical Exam  Immunization History  Administered Date(s) Administered   Influenza-Unspecified 12/24/2020, 12/04/2022   Tdap 04/10/2022   Zoster Recombinant(Shingrix) 12/04/2022    Health Maintenance  Topic Date Due   Cervical Cancer Screening (HPV/Pap Cotest)  Never done   Zoster Vaccines- Shingrix (2 of 2) 01/29/2023   MAMMOGRAM  06/16/2024   Colonoscopy  05/27/2029   DTaP/Tdap/Td (2 - Td or Tdap) 04/10/2032   INFLUENZA VACCINE  Completed   Hepatitis C Screening  Completed   HPV VACCINES  Aged Out   COVID-19 Vaccine  Discontinued   HIV Screening  Discontinued    ASSESSMENT:  well woman  PLAN:  mammogram pap smear return annually or  prn   Discussed health benefits of physical activity, and encouraged her to engage in regular exercise appropriate for her age and condition.   Problem List Items Addressed This Visit     Annual physical exam - Primary  Today your medical history was reviewed and routine physical exam with labs was performed. Recommend 150 minutes of moderate intensity exercise weekly and consuming a well-balanced diet. Advised to stop smoking if a smoker, avoid smoking if a non-smoker, limit alcohol consumption to 1 drink per day for women and 2 drinks per day for men, and avoid illicit drug use.  Counseled in mental health awareness and when to seek medical care. Vaccine maintenance discussed. Appropriate health maintenance items reviewed. Return to office in 1 year for annual physical exam.       Cervical cancer screening    Routine PAP performed without complication. Follow-up as indicated by results.       Relevant Orders   Pap, TP Imaging w/ CT/GC and w/ HPV RNA, rflx HPV Type 16/18   Moderate mixed hyperlipidemia not requiring statin therapy    Your labs showed elevated cholesterol. I recommend consuming a heart healthy diet such as Mediterranean diet or DASH diet with whole grains, fruits, vegetable, fish, lean meats, nuts, and olive oil. Limit sweets and processed foods. I also encourage moderate intensity exercise 150 minutes weekly. This is 3-5 times weekly for 30-50 minutes each session. Goal should be pace of 3 miles/hours, or walking 1.5 miles in 30 minutes. The 10-year ASCVD risk score (Arnett DK, et al., 2019) is: 4.7%       Relevant Orders   Lipid panel   Class 1 obesity with serious comorbidity and body mass index (BMI) of 33.0 to 33.9 in adult    Continue low calorie, heart healthy diet and moderate intensity exercise 150 minutes weekly. This is 3-5 times weekly for 30-50 minutes each session. Goal should be pace of 3 miles/hours, or walking 1.5 miles in 30 minutes and include  strength training.       Return in about 1 year (around 02/03/2024) for annual physical with labs 1 week prior.     Park Meo, FNP

## 2023-02-03 NOTE — Addendum Note (Signed)
Addended by: Renee Pain on: 02/03/2023 10:13 AM   Modules accepted: Orders

## 2023-02-03 NOTE — Assessment & Plan Note (Addendum)

## 2023-02-03 NOTE — Assessment & Plan Note (Signed)
Your labs showed elevated cholesterol. I recommend consuming a heart healthy diet such as Mediterranean diet or DASH diet with whole grains, fruits, vegetable, fish, lean meats, nuts, and olive oil. Limit sweets and processed foods. I also encourage moderate intensity exercise 150 minutes weekly. This is 3-5 times weekly for 30-50 minutes each session. Goal should be pace of 3 miles/hours, or walking 1.5 miles in 30 minutes. The 10-year ASCVD risk score (Arnett DK, et al., 2019) is: 4.7%

## 2023-02-03 NOTE — Assessment & Plan Note (Signed)
Routine PAP performed without complication. Follow-up as indicated by results.

## 2023-02-09 LAB — PAP, TP IMAGING W/ HPV RNA, RFLX HPV TYPE 16,18/45: HPV DNA High Risk: NOT DETECTED

## 2023-02-09 LAB — C. TRACHOMATIS/N. GONORRHOEAE RNA
C. trachomatis RNA, TMA: NOT DETECTED
N. gonorrhoeae RNA, TMA: NOT DETECTED

## 2023-02-09 LAB — PAP, TP IMAGING W/ CT/GC AND W/ HPV RNA, RFLX HPV TYPE 16/18

## 2023-02-12 MED FILL — Lisinopril & Hydrochlorothiazide Tab 20-12.5 MG: ORAL | 90 days supply | Qty: 90 | Fill #2 | Status: AC

## 2023-03-30 ENCOUNTER — Other Ambulatory Visit: Payer: Self-pay

## 2023-03-30 ENCOUNTER — Other Ambulatory Visit: Payer: Self-pay | Admitting: Family Medicine

## 2023-03-30 MED ORDER — LEVOTHYROXINE SODIUM 25 MCG PO TABS
25.0000 ug | ORAL_TABLET | Freq: Every day | ORAL | 2 refills | Status: DC
  Filled 2023-03-30: qty 90, 90d supply, fill #0
  Filled 2023-06-30: qty 90, 90d supply, fill #1
  Filled 2023-09-28: qty 90, 90d supply, fill #2

## 2023-03-30 NOTE — Telephone Encounter (Signed)
Requested Prescriptions  Pending Prescriptions Disp Refills   levothyroxine (SYNTHROID) 25 MCG tablet 90 tablet 2    Sig: Take 1 tablet (25 mcg total) by mouth daily.     Endocrinology:  Hypothyroid Agents Failed - 03/30/2023 12:25 PM      Failed - Valid encounter within last 12 months    Recent Outpatient Visits   None     Future Appointments             In 2 days Terri Piedra, DO [facility]   In 10 months Park Meo, FNP [facility]            Passed - TSH in normal range and within 360 days    TSH  Date Value Ref Range Status  01/29/2023 1.81 0.40 - 4.50 mIU/L Final

## 2023-03-31 ENCOUNTER — Other Ambulatory Visit: Payer: Self-pay

## 2023-04-01 ENCOUNTER — Other Ambulatory Visit: Payer: Self-pay

## 2023-04-01 ENCOUNTER — Encounter: Payer: Self-pay | Admitting: Dermatology

## 2023-04-01 ENCOUNTER — Ambulatory Visit: Payer: Commercial Managed Care - PPO | Admitting: Dermatology

## 2023-04-01 VITALS — BP 107/70 | HR 62

## 2023-04-01 DIAGNOSIS — L739 Follicular disorder, unspecified: Secondary | ICD-10-CM | POA: Diagnosis not present

## 2023-04-01 DIAGNOSIS — L281 Prurigo nodularis: Secondary | ICD-10-CM

## 2023-04-01 DIAGNOSIS — Z808 Family history of malignant neoplasm of other organs or systems: Secondary | ICD-10-CM | POA: Diagnosis not present

## 2023-04-01 DIAGNOSIS — L280 Lichen simplex chronicus: Secondary | ICD-10-CM | POA: Diagnosis not present

## 2023-04-01 MED ORDER — CLINDAMYCIN PHOSPHATE 1 % EX LOTN
TOPICAL_LOTION | Freq: Every day | CUTANEOUS | 6 refills | Status: AC
  Filled 2023-04-01: qty 60, 30d supply, fill #0
  Filled 2023-04-27: qty 60, 30d supply, fill #1
  Filled 2023-06-16 – 2023-06-30 (×2): qty 60, 30d supply, fill #2
  Filled 2023-08-12: qty 60, 30d supply, fill #3
  Filled 2023-09-28: qty 60, 30d supply, fill #4
  Filled 2023-11-16: qty 60, 30d supply, fill #5
  Filled 2024-01-11: qty 60, 30d supply, fill #6

## 2023-04-01 MED ORDER — TACROLIMUS 0.1 % EX OINT
TOPICAL_OINTMENT | Freq: Two times a day (BID) | CUTANEOUS | 6 refills | Status: AC
  Filled 2023-04-01: qty 100, 30d supply, fill #0
  Filled 2023-05-13: qty 100, 30d supply, fill #1
  Filled 2023-06-16 – 2023-06-30 (×2): qty 100, 30d supply, fill #2
  Filled 2023-09-28: qty 100, 30d supply, fill #3
  Filled 2023-11-16: qty 100, 30d supply, fill #4
  Filled 2024-01-11: qty 100, 30d supply, fill #5

## 2023-04-01 NOTE — Progress Notes (Signed)
   New Patient Visit   Subjective  Alicia Mcgrath is a 63 y.o. female who presents for the following: Spot Check  Patient states she has spots located at the left shoulder and buttock that she would like to have examined. Patient reports the areas have been there for several years. She reports the areas are bothersome. Patient reports the areas are itchy. Patient rates irritation 8 out of 10. She states that the areas have not spread. Patient reports she has not previously been treated for these areas. Patient denies Hx of bx. Patient reports family history of skin cancer(s).  The following portions of the chart were reviewed this encounter and updated as appropriate: medications, allergies, medical history  Review of Systems:  No other skin or systemic complaints except as noted in HPI or Assessment and Plan.  Objective  Well appearing patient in no apparent distress; mood and affect are within normal limits.  A focused examination was performed of the following areas: Left Shoulder and Buttocks  Relevant exam findings are noted in the Assessment and Plan.          Assessment & Plan   LICHEN SIMPLEX CHRONICUS/PRURIGO NODULARIS Exam: Excoriated lichenified papules and/or plaques at left shoulder  Flared  Lichen simplex chronicus (LSC) is a persistent itchy area of thickened skin that is induced by chronic rubbing and/or scratching (chronic dermatitis).  These areas may be pink, hyperpigmented and may have excoriations and bumps (prurigo nodules- PN).  LSC/PN is commonly observed in uncontrolled atopic dermatitis and other forms of eczema, and in other itchy skin conditions (eg, insect bites, scabies).  Sometimes it is not possible to know initial cause of LSC/PN if it has been present for a long time.  It generally responds well to treatment with high potency topical steroids.  It is important to stop rubbing/scratching the area in order to break the itch-scratch-rash-itch cycle, in  order for the rash to resolve.   Treatment Plan: - We will prescribe Tacrolimus ointment 2 times daily until cleared - Recommended applying vasoline - Avoid picking/rubbing/scratching  FOLLICULITIS Exam: Perifollicular erythematous papules and pustules  Folliculitis occurs due to inflammation of the superficial hair follicle (pore), resulting in acne-like lesions (pus bumps). It can be infectious (bacterial, fungal) or noninfectious (shaving, tight clothing, heat/sweat, medications).  Folliculitis can be acute or chronic and recommended treatment depends on the underlying cause of folliculitis.  Treatment Plan: - We will prescribe Clindamycin lotion to apply directly to the effected areas - We will prescribe tacrolimus ointment to apply directly to the areas for excessive itchy sensations LICHEN SIMPLEX CHRONICUS   Related Medications tacrolimus (PROTOPIC) 0.1 % ointment Apply topically 2 (two) times daily. FOLLICULITIS   Related Medications clindamycin (CLEOCIN-T) 1 % lotion Apply topically daily.  Return if symptoms worsen or fail to improve.  Documentation: I have reviewed the above documentation for accuracy and completeness, and I agree with the above.  Langston Reusing, DO

## 2023-04-01 NOTE — Patient Instructions (Addendum)
Hello Alicia Mcgrath,  Thank you for visiting [place] today. We appreciate your commitment to improving your skin health. Here is a summary of the key instructions from today's consultation:   - Folliculitis on Buttocks: Use Clindamycin lotion every other day to treat and prevent new spots.   - Application: Mix the Clindamycin with your Tea Tree and Goat's Milk lotion as discussed.   - Duration: Continue this treatment until your next appointment or as directed.  - Excoriations and Itching: Apply Tacrolimus ointment daily to calm inflammation and prevent further irritation.   - Additional Use: Also recommended for the lichenified plaque on your left shoulder, to be used twice a day until the area is smooth.  - General Skin Care: For any new bug bites, apply hydrocortisone cream promptly to reduce inflammation and prevent post-inflammatory pigment changes.  - Prescription Refills: We have provided you with ample refills for your medications.  - Follow-Up: Schedule a follow-up appointment in two months if the symptoms have not fully resolved. We expect significant improvement by then.    We look forward to seeing the positive changes in your next visit. If you have any questions or concerns before then, please do not hesitate to contact our office.  Warm regards,  Dr. Langston Reusing, Dermatology   Important Information   Due to recent changes in healthcare laws, you may see results of your pathology and/or laboratory studies on MyChart before the doctors have had a chance to review them. We understand that in some cases there may be results that are confusing or concerning to you. Please understand that not all results are received at the same time and often the doctors may need to interpret multiple results in order to provide you with the best plan of care or course of treatment. Therefore, we ask that you please give [place] 2 business days to thoroughly review all your results before contacting the  office for clarification. Should we see a critical lab result, you will be contacted sooner.     If You Need Anything After Your Visit   If you have any questions or concerns for your doctor, please call our main line at [phone]. If no one answers, please leave a voicemail as directed and we will return your call as soon as possible. Messages left after 4 pm will be answered the following business day.    You may also send [place] a message via MyChart. We typically respond to MyChart messages within 1-2 business days.  For prescription refills, please ask your pharmacy to contact our office. Our fax number is [phone].  If you have an urgent issue when the clinic is closed that cannot wait until the next business day, you can page your doctor at the number below.     Please note that while we do our best to be available for urgent issues outside of office hours, we are not available 24/7.    If you have an urgent issue and are unable to reach [place], you may choose to seek medical care at your doctor's office, retail clinic, urgent care center, or emergency room.   If you have a medical emergency, please immediately call 911 or go to the emergency department. In the event of inclement weather, please call our main line at [phone] for an update on the status of any delays or closures.  Dermatology Medication Tips: Please keep the boxes that topical medications come in in order to help keep track of the instructions about  where and how to use these. Pharmacies typically print the medication instructions only on the boxes and not directly on the medication tubes.   If your medication is too expensive, please contact our office at [phone] or send [place] a message through MyChart.    We are unable to tell what your co-pay for medications will be in advance as this is different depending on your insurance coverage. However, we may be able to find a substitute medication at lower cost or  fill out paperwork to get insurance to cover a needed medication.    If a prior authorization is required to get your medication covered by your insurance company, please allow [place] 1-2 business days to complete this process.   Drug prices often vary depending on where the prescription is filled and some pharmacies may offer cheaper prices.   The website www.goodrx.com contains coupons for medications through different pharmacies. The prices here do not account for what the cost may be with help from insurance (it may be cheaper with your insurance), but the website can give you the price if you did not use any insurance.  - You can print the associated coupon and take it with your prescription to the pharmacy.  - You may also stop by our office during regular business hours and pick up a GoodRx coupon card.  - If you need your prescription sent electronically to a different pharmacy, notify our office through [facility] or by phone at [phone]

## 2023-04-13 ENCOUNTER — Other Ambulatory Visit: Payer: Self-pay | Admitting: Family Medicine

## 2023-04-13 ENCOUNTER — Other Ambulatory Visit: Payer: Self-pay

## 2023-04-14 ENCOUNTER — Other Ambulatory Visit: Payer: Self-pay

## 2023-04-14 MED FILL — Duloxetine HCl Enteric Coated Pellets Cap 20 MG (Base Eq): ORAL | 90 days supply | Qty: 180 | Fill #0 | Status: AC

## 2023-04-16 ENCOUNTER — Other Ambulatory Visit: Payer: Self-pay

## 2023-05-06 ENCOUNTER — Other Ambulatory Visit: Payer: Commercial Managed Care - PPO

## 2023-05-06 DIAGNOSIS — E782 Mixed hyperlipidemia: Secondary | ICD-10-CM

## 2023-05-06 DIAGNOSIS — E7800 Pure hypercholesterolemia, unspecified: Secondary | ICD-10-CM

## 2023-05-06 LAB — LIPID PANEL
Cholesterol: 213 mg/dL — ABNORMAL HIGH (ref <200)
HDL: 50 mg/dL (ref >=50)
LDL Cholesterol (Calc): 133 mg/dL — ABNORMAL HIGH
Non-HDL Cholesterol (Calc): 163 mg/dL — ABNORMAL HIGH (ref <130)
Total CHOL/HDL Ratio: 4.3 (calc) (ref <5.0)
Triglycerides: 166 mg/dL — ABNORMAL HIGH (ref <150)

## 2023-05-13 ENCOUNTER — Other Ambulatory Visit: Payer: Self-pay

## 2023-05-13 ENCOUNTER — Other Ambulatory Visit: Payer: Self-pay | Admitting: Family Medicine

## 2023-05-13 MED ORDER — LISINOPRIL-HYDROCHLOROTHIAZIDE 20-12.5 MG PO TABS
1.0000 | ORAL_TABLET | Freq: Every day | ORAL | 2 refills | Status: DC
  Filled 2023-05-13: qty 90, 90d supply, fill #0
  Filled 2023-08-12: qty 90, 90d supply, fill #1
  Filled 2023-11-16: qty 90, 90d supply, fill #2

## 2023-06-16 ENCOUNTER — Other Ambulatory Visit: Payer: Self-pay

## 2023-06-29 ENCOUNTER — Other Ambulatory Visit: Payer: Self-pay

## 2023-06-30 ENCOUNTER — Other Ambulatory Visit: Payer: Self-pay

## 2023-07-13 ENCOUNTER — Other Ambulatory Visit: Payer: Self-pay

## 2023-07-13 ENCOUNTER — Other Ambulatory Visit: Payer: Self-pay | Admitting: Family Medicine

## 2023-07-13 MED ORDER — DULOXETINE HCL 20 MG PO CPEP
40.0000 mg | ORAL_CAPSULE | Freq: Every day | ORAL | 0 refills | Status: DC
  Filled 2023-07-13: qty 180, 90d supply, fill #0

## 2023-08-19 ENCOUNTER — Other Ambulatory Visit: Payer: Self-pay

## 2023-08-21 ENCOUNTER — Other Ambulatory Visit: Payer: Self-pay

## 2023-09-28 ENCOUNTER — Other Ambulatory Visit: Payer: Self-pay

## 2023-10-12 ENCOUNTER — Other Ambulatory Visit: Payer: Self-pay | Admitting: Family Medicine

## 2023-10-13 ENCOUNTER — Other Ambulatory Visit: Payer: Self-pay | Admitting: Family Medicine

## 2023-10-13 ENCOUNTER — Other Ambulatory Visit: Payer: Self-pay

## 2023-10-14 ENCOUNTER — Other Ambulatory Visit: Payer: Self-pay

## 2023-10-14 MED FILL — Duloxetine HCl Enteric Coated Pellets Cap 20 MG (Base Eq): ORAL | 90 days supply | Qty: 180 | Fill #0 | Status: AC

## 2024-01-11 ENCOUNTER — Other Ambulatory Visit: Payer: Self-pay | Admitting: Family Medicine

## 2024-01-12 ENCOUNTER — Other Ambulatory Visit: Payer: Self-pay

## 2024-01-12 MED ORDER — DULOXETINE HCL 20 MG PO CPEP
40.0000 mg | ORAL_CAPSULE | Freq: Every day | ORAL | 0 refills | Status: DC
  Filled 2024-01-12: qty 180, 90d supply, fill #0

## 2024-01-20 DIAGNOSIS — E119 Type 2 diabetes mellitus without complications: Secondary | ICD-10-CM | POA: Diagnosis not present

## 2024-01-20 DIAGNOSIS — H5213 Myopia, bilateral: Secondary | ICD-10-CM | POA: Diagnosis not present

## 2024-01-20 DIAGNOSIS — H52223 Regular astigmatism, bilateral: Secondary | ICD-10-CM | POA: Diagnosis not present

## 2024-01-20 DIAGNOSIS — H04123 Dry eye syndrome of bilateral lacrimal glands: Secondary | ICD-10-CM | POA: Diagnosis not present

## 2024-01-20 DIAGNOSIS — H2513 Age-related nuclear cataract, bilateral: Secondary | ICD-10-CM | POA: Diagnosis not present

## 2024-02-03 ENCOUNTER — Other Ambulatory Visit: Payer: Commercial Managed Care - PPO

## 2024-02-03 DIAGNOSIS — E559 Vitamin D deficiency, unspecified: Secondary | ICD-10-CM | POA: Diagnosis not present

## 2024-02-03 DIAGNOSIS — Z0000 Encounter for general adult medical examination without abnormal findings: Secondary | ICD-10-CM

## 2024-02-07 ENCOUNTER — Ambulatory Visit: Payer: Self-pay | Admitting: Family Medicine

## 2024-02-08 ENCOUNTER — Ambulatory Visit: Payer: Commercial Managed Care - PPO | Admitting: Family Medicine

## 2024-02-08 ENCOUNTER — Other Ambulatory Visit: Payer: Self-pay

## 2024-02-08 ENCOUNTER — Encounter: Payer: Self-pay | Admitting: Family Medicine

## 2024-02-08 VITALS — BP 116/70 | HR 70 | Ht 61.0 in | Wt 189.2 lb

## 2024-02-08 DIAGNOSIS — M1990 Unspecified osteoarthritis, unspecified site: Secondary | ICD-10-CM

## 2024-02-08 DIAGNOSIS — G4730 Sleep apnea, unspecified: Secondary | ICD-10-CM | POA: Diagnosis not present

## 2024-02-08 DIAGNOSIS — Z23 Encounter for immunization: Secondary | ICD-10-CM

## 2024-02-08 DIAGNOSIS — K219 Gastro-esophageal reflux disease without esophagitis: Secondary | ICD-10-CM | POA: Diagnosis not present

## 2024-02-08 DIAGNOSIS — E039 Hypothyroidism, unspecified: Secondary | ICD-10-CM

## 2024-02-08 DIAGNOSIS — I10 Essential (primary) hypertension: Secondary | ICD-10-CM

## 2024-02-08 DIAGNOSIS — F339 Major depressive disorder, recurrent, unspecified: Secondary | ICD-10-CM

## 2024-02-08 DIAGNOSIS — E782 Mixed hyperlipidemia: Secondary | ICD-10-CM

## 2024-02-08 DIAGNOSIS — Z0001 Encounter for general adult medical examination with abnormal findings: Secondary | ICD-10-CM | POA: Diagnosis not present

## 2024-02-08 DIAGNOSIS — F330 Major depressive disorder, recurrent, mild: Secondary | ICD-10-CM | POA: Diagnosis not present

## 2024-02-08 DIAGNOSIS — Z0000 Encounter for general adult medical examination without abnormal findings: Secondary | ICD-10-CM | POA: Insufficient documentation

## 2024-02-08 LAB — CBC WITH DIFFERENTIAL/PLATELET
Absolute Lymphocytes: 2195 {cells}/uL (ref 850–3900)
Absolute Monocytes: 314 {cells}/uL (ref 200–950)
Basophils Absolute: 109 {cells}/uL (ref 0–200)
Basophils Relative: 1.7 %
Eosinophils Absolute: 19 {cells}/uL (ref 15–500)
Eosinophils Relative: 0.3 %
HCT: 36.8 % (ref 35.9–46.0)
Hemoglobin: 12.1 g/dL (ref 11.7–15.5)
MCH: 29.3 pg (ref 27.0–33.0)
MCHC: 32.9 g/dL (ref 31.6–35.4)
MCV: 89.1 fL (ref 81.4–101.7)
MPV: 9.6 fL (ref 7.5–12.5)
Monocytes Relative: 4.9 %
Neutro Abs: 3763 {cells}/uL (ref 1500–7800)
Neutrophils Relative %: 58.8 %
Platelets: 232 Thousand/uL (ref 140–400)
RBC: 4.13 Million/uL (ref 3.80–5.10)
RDW: 12.7 % (ref 11.0–15.0)
Total Lymphocyte: 34.3 %
WBC: 6.4 Thousand/uL (ref 3.8–10.8)

## 2024-02-08 LAB — VITAMIN D 25 HYDROXY (VIT D DEFICIENCY, FRACTURES): Vit D, 25-Hydroxy: 57 ng/mL (ref 30–100)

## 2024-02-08 LAB — TEST AUTHORIZATION

## 2024-02-08 LAB — COMPREHENSIVE METABOLIC PANEL WITH GFR
AG Ratio: 1.8 (calc) (ref 1.0–2.5)
ALT: 19 U/L (ref 6–29)
AST: 25 U/L (ref 10–35)
Albumin: 4.3 g/dL (ref 3.6–5.1)
Alkaline phosphatase (APISO): 72 U/L (ref 37–153)
BUN: 17 mg/dL (ref 7–25)
CO2: 32 mmol/L (ref 20–32)
Calcium: 9.7 mg/dL (ref 8.6–10.4)
Chloride: 101 mmol/L (ref 98–110)
Creat: 0.95 mg/dL (ref 0.50–1.05)
Globulin: 2.4 g/dL (ref 1.9–3.7)
Glucose, Bld: 89 mg/dL (ref 65–99)
Potassium: 4.3 mmol/L (ref 3.5–5.3)
Sodium: 140 mmol/L (ref 135–146)
Total Bilirubin: 0.3 mg/dL (ref 0.2–1.2)
Total Protein: 6.7 g/dL (ref 6.1–8.1)
eGFR: 68 mL/min/1.73m2 (ref >=60)

## 2024-02-08 LAB — LIPID PANEL
Cholesterol: 221 mg/dL — ABNORMAL HIGH (ref <200)
HDL: 53 mg/dL (ref >=50)
LDL Cholesterol (Calc): 128 mg/dL — ABNORMAL HIGH
Non-HDL Cholesterol (Calc): 168 mg/dL — ABNORMAL HIGH (ref <130)
Total CHOL/HDL Ratio: 4.2 (calc) (ref <5.0)
Triglycerides: 263 mg/dL — ABNORMAL HIGH (ref <150)

## 2024-02-08 LAB — TSH: TSH: 2.13 m[IU]/L (ref 0.40–4.50)

## 2024-02-08 MED ORDER — ICOSAPENT ETHYL 1 G PO CAPS
2.0000 g | ORAL_CAPSULE | Freq: Two times a day (BID) | ORAL | 3 refills | Status: AC
  Filled 2024-02-08 (×2): qty 120, 30d supply, fill #0

## 2024-02-08 NOTE — Progress Notes (Signed)
 Complete physical exam  Patient: Alicia Mcgrath    DOB: [birth year] 63 y.o.   MRN: [id number]  Chief Complaint  Patient presents with   Annual Exam    Having hip and joint pain, along with headaches for the past week     Subjective:    Alicia Mcgrath is a 63 y.o. female who presents today for a complete physical exam. She reports consuming a general diet. Home exercise routine includes walking 4 hrs per week. She generally feels well. She reports sleeping fairly well. She does not have additional problems to discuss today.   PMH includes OSA, depression, hypothyroidism, HTN, HLD, GERD, DDD  HTN: on Lisinopril -hydrochlorothiazide  20-12.5mg  daily Denies chest pain, palpitations, recurrent headaches, vision changes, lightheadedness, dizziness, dyspnea on exertion, or swelling of extremities.  HLD: poor diet, limited exercise Lipid Panel     Component Value Date/Time   CHOL 221 (H) 02/03/2024 0802   TRIG 263 (H) 02/03/2024 0802   HDL 53 02/03/2024 0802   CHOLHDL 4.2 02/03/2024 0802   LDLCALC 128 (H) 02/03/2024 0802  The 10-year ASCVD risk score (Arnett DK, et al., 2019) is: 4.4%  GERD: diet controlled Denies hematemesis, melena, hematochezia, occult blood in stool, iron deficiency anemia, anorexia, unexplained weight loss, dysphagia, odynophagia, persistent vomiting  Hypothyroidism: on Synthroid  25mcg daily, TSH 2.13 Denies fatigue, cold or heat intolerance, weight gain or loss, constipation, diarrhea, palpitations, lower extremity edema, anxiety/depression  OSA: not on CPAP  Obesity: poor diet, limited exercise Filed Weights   02/08/24 0806  Weight: 189 lb 3.2 oz (85.8 kg)    Depression: on Cymbalta     02/08/2024    8:22 AM 02/03/2023    8:25 AM 04/10/2022    8:51 AM 11/07/2021    4:05 PM  Depression screen PHQ 2/9  Decreased Interest 1 0 0 0  Down, Depressed, Hopeless 1 0 0 0  PHQ - 2 Score 2 0 0 0  Altered sleeping 1 0 0   Tired, decreased energy 1 1 1     Change in appetite 1 0 1   Feeling bad or failure about yourself  0 1 0   Trouble concentrating 0 0 0   Moving slowly or fidgety/restless 0 0 0   Suicidal thoughts 0 0 0   PHQ-9 Score 5 2  2     Difficult doing work/chores Not difficult at all Not difficult at all Not difficult at all      Data saved with a previous flowsheet row definition      02/08/2024    8:22 AM 02/03/2023    8:25 AM 04/10/2022    8:51 AM  GAD 7 : Generalized Anxiety Score  Nervous, Anxious, on Edge 0 1 0  Control/stop worrying 0 0 0  Worry too much - different things 0 0 0  Trouble relaxing 0 1 0  Restless 0 0 0  Easily annoyed or irritable 0 0 0  Afraid - awful might happen 0 0 0  Total GAD 7 Score 0 2 0  Anxiety Difficulty Not difficult at all Not difficult at all Not difficult at all         Discussed the use of AI scribe software for clinical note transcription with the patient, who gave verbal consent to proceed.  History of Present Illness Alicia Mcgrath is a 63 year old female who presents for a physical exam.  Over the past six months, she has gained weight, increasing from 173 pounds to 189 pounds, which she attributes  to stress and poor dietary habits, including insufficient intake of fruits and vegetables. She is disappointed in herself for this weight gain.  Her cholesterol levels have increased slightly since her last visit, although her HDL remains stable. She is concerned about her triglycerides, which she believes are high due to her fondness for sweets. She takes omega-3 supplements, specifically cod liver oil, 1000 mg twice daily, for her triglycerides. She has a family history of heart disease, with her father having experienced angina and later suffering strokes and a heart attack.  She manages her high blood pressure with lisinopril  hydrochlorothiazide . No chest pain, palpitations, or swelling in her extremities, but she experiences occasional headaches, which she attributes to sinus  issues and weather changes.  She has sleep apnea but has not been using her CPAP machine due to it being broken. She wakes up with a need to take deep breaths and reports ongoing symptoms related to sleep apnea. She has not yet followed up on a referral for a new CPAP machine due to personal and financial constraints.  She takes duloxetine  40 mg daily for mood management, which she finds effective. She has discontinued Synthroid  and is using a thyroid support supplement, noting her TSH levels remain normal.  She experiences arthritis-related pain, particularly after prolonged periods of sitting or standing. Her hips feel 'achy' after a long workday. She also reports hair thinning, which she attributes to age and possibly menopause.  She walks her dog four days a week for 30 minutes to an hour and a half, depending on her schedule. She resumed this routine a month ago after her dog recovered from a knee injury.  No smoking or excessive alcohol consumption. She reports occasional ringing in her ears, usually at night when tired, and attributes sneezing at work to potential mold allergies.   Most recent fall risk assessment:    02/08/2024    8:22 AM  Fall Risk   Falls in the past year? 1  Number falls in past yr: 0  Injury with Fall? 0  Risk for fall due to : No Fall Risks  Follow up Falls evaluation completed     Most recent depression screenings:    02/08/2024    8:22 AM 02/03/2023    8:25 AM  PHQ 2/9 Scores  PHQ - 2 Score 2 0  PHQ- 9 Score 5 2      Data saved with a previous flowsheet row definition    Vision:Within last year and Dental: No current dental problems and Receives regular dental care Patient Active Problem List   Diagnosis Date Noted   Physical exam, annual 02/08/2024   Cervical cancer screening 02/03/2023   Moderate mixed hyperlipidemia not requiring statin therapy 02/03/2023   Depression 11/07/2021   Hypertension 11/07/2021   Hypothyroidism 11/07/2021    Sleep apnea 11/07/2021   Morbid obesity (HCC) 11/07/2021   Acquired atrophy of thyroid 01/04/2021   Family history of malignant neoplasm of breast 01/04/2021   Gastroesophageal reflux disease 01/04/2021   Mild episode of recurrent major depressive disorder 01/04/2021   Primary osteoarthritis of both knees 01/04/2021   Osteoarthritis (arthritis due to wear and tear of joints) 12/29/2020   Family history of esophageal cancer 07/12/2020   Degenerative disc disease, cervical 07/17/2012   Past Medical History:  Diagnosis Date   Allergy    seasonal   Anemia    Arthritis    Cataract    begining   DDD (degenerative disc disease), cervical  Depression    GERD (gastroesophageal reflux disease)    Hyperlipidemia    Hypertension    Sleep apnea    c pap   Thyroid disease    Past Surgical History:  Procedure Laterality Date   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   neck injections     WISDOM TOOTH EXTRACTION     Social History   Tobacco Use   Smoking status: Never   Smokeless tobacco: Never  Vaping Use   Vaping status: Never Used  Substance Use Topics   Alcohol use: Not Currently    Comment: once a year with egg nog   Drug use: No   Family History  Problem Relation Age of Onset   Breast cancer Mother    Cancer Mother    Esophageal cancer Father    Colon polyps Father    Heart disease Father    Stroke Father    Cancer Father    Stroke Maternal Grandmother    Diabetes Maternal Grandmother    Cancer Maternal Grandfather    Diabetes Maternal Grandfather    Diabetes Paternal Grandfather    Alcohol abuse Paternal Grandfather    Stomach cancer Other    Crohn's disease Neg Hx    Colon cancer Neg Hx    Rectal cancer Neg Hx    Ulcerative colitis Neg Hx    No Known Allergies    Patient Care Team: Kayla Jeoffrey RAMAN, FNP as PCP - General (Family Medicine)   Review of Systems  Constitutional: Negative.   HENT: Negative.    Eyes: Negative.    Respiratory: Negative.    Cardiovascular: Negative.   Gastrointestinal: Negative.   Genitourinary: Negative.   Musculoskeletal:  Positive for joint pain.  Skin: Negative.   Neurological:  Positive for headaches.  Endo/Heme/Allergies: Negative.   Psychiatric/Behavioral:  Positive for depression.   All other systems reviewed and are negative.     Objective:    BP 116/70   Pulse 70   Ht 5' 1 (1.549 m)   Wt 189 lb 3.2 oz (85.8 kg)   LMP  (Approximate)   SpO2 99%   BMI 35.75 kg/m  BP Readings from Last 3 Encounters:  02/08/24 116/70  04/01/23 107/70  02/03/23 120/80   Wt Readings from Last 3 Encounters:  02/08/24 189 lb 3.2 oz (85.8 kg)  02/03/23 178 lb 4 oz (80.9 kg)  12/04/22 180 lb (81.6 kg)      Physical Exam Vitals and nursing note reviewed.  Constitutional:      Appearance: Normal appearance. She is obese.  HENT:     Head: Normocephalic and atraumatic.     Right Ear: Tympanic membrane, ear canal and external ear normal.     Left Ear: Tympanic membrane, ear canal and external ear normal.     Nose: Nose normal.     Mouth/Throat:     Mouth: Mucous membranes are moist.     Pharynx: Oropharynx is clear.  Eyes:     Extraocular Movements: Extraocular movements intact.     Conjunctiva/sclera: Conjunctivae normal.     Pupils: Pupils are equal, round, and reactive to light.  Cardiovascular:     Rate and Rhythm: Normal rate and regular rhythm.     Pulses: Normal pulses.     Heart sounds: Normal heart sounds.  Pulmonary:     Effort: Pulmonary effort is normal.     Breath sounds: Normal breath sounds.  Abdominal:     General:  Bowel sounds are normal.     Palpations: Abdomen is soft.  Musculoskeletal:        General: Normal range of motion.     Cervical back: Normal range of motion and neck supple.  Skin:    General: Skin is warm and dry.     Capillary Refill: Capillary refill takes less than 2 seconds.  Neurological:     General: No focal deficit present.      Mental Status: She is alert and oriented to person, place, and time. Mental status is at baseline.  Psychiatric:        Mood and Affect: Mood normal.        Behavior: Behavior normal.        Thought Content: Thought content normal.        Judgment: Judgment normal.       No results found for any visits on 02/08/24.      Assessment & Plan:    Routine Health Maintenance and Physical Exam Immunization History  Administered Date(s) Administered   Influenza-Unspecified 12/24/2020, 12/04/2022   PNEUMOCOCCAL CONJUGATE-20 02/08/2024   Tdap 04/10/2022   Zoster Recombinant(Shingrix ) 12/04/2022, 02/03/2023    Health Maintenance  Topic Date Due   Influenza Vaccine  06/07/2024 (Originally 10/09/2023)   Mammogram  06/16/2024   Cervical Cancer Screening (HPV/Pap Cotest)  02/03/2028   Colonoscopy  05/27/2029   DTaP/Tdap/Td (2 - Td or Tdap) 04/10/2032   Pneumococcal Vaccine: 50+ Years  Completed   Hepatitis C Screening  Completed   Zoster Vaccines- Shingrix   Completed   Hepatitis B Vaccines 19-59 Average Risk  Aged Out   HPV VACCINES  Aged Out   Meningococcal B Vaccine  Aged Out   COVID-19 Vaccine  Discontinued   HIV Screening  Discontinued    Discussed health benefits of physical activity, and encouraged her to engage in regular exercise appropriate for her age and condition.  Problem List Items Addressed This Visit     Depression   Hypertension   Relevant Medications   icosapent Ethyl (VASCEPA) 1 g capsule   Hypothyroidism   Sleep apnea   Relevant Orders   Ambulatory referral to Sleep Studies   Morbid obesity (HCC)   Gastroesophageal reflux disease   Mild episode of recurrent major depressive disorder   Osteoarthritis (arthritis due to wear and tear of joints)   Moderate mixed hyperlipidemia not requiring statin therapy   Relevant Medications   icosapent Ethyl (VASCEPA) 1 g capsule   Other Relevant Orders   Lipid panel   Physical exam, annual - Primary   Other Visit  Diagnoses       Need for vaccination       Relevant Orders   Pneumococcal conjugate vaccine 20-valent (Prevnar 20) (Completed)       Assessment and Plan Assessment & Plan Adult Wellness Visit Routine wellness visit with focus on diet, exercise, stress management, and vitamin D supplementation for hair loss and bone health. - Continue exercise routine, 30 minutes daily, 5 days a week. - Increase fruits and vegetables intake. - Checked vitamin D level. - Switch to calcium and vitamin D supplement with 1000 units of vitamin D.  Mixed hyperlipidemia Cholesterol slightly elevated, high triglycerides. Discussed statin therapy concerns, considered omega-3 fatty acids. - Prescribed Vascepa 2 grams twice daily. - Monitor cholesterol levels biannually.  Essential hypertension Blood pressure controlled with lisinopril -hydrochlorothiazide . Symptoms reported include headaches, cough, and sneezing, attributed to sinus issues and possible allergies. - Continue lisinopril -hydrochlorothiazide  20-12.5 mg daily. - Monitor  blood pressure biannually.  Obesity Weight gain due to diet and stress. Discussed weight management's impact on sleep apnea and health. Opted for traditional methods over Zepbound. - Continue exercise and dietary modifications. - Referred to sleep specialist for sleep apnea management.  Sleep apnea Persistent symptoms despite weight loss. Previous CPAP use discontinued. Discussed weight loss and CPAP benefits. - Referred to sleep specialist for evaluation and potential CPAP therapy. - Requested records from previous sleep study in [place].  Hypothyroidism TSH levels normal, off Synthroid  with no symptoms. Discussed thyroid support supplements. - Monitor TSH levels biannually.  Depression Occasional stress and frustration, no suicidal ideation. Duloxetine  40 mg effective. - Continue duloxetine  40 mg daily.    Return in about 6 months (around 08/08/2024) for chronic  follow-up with labs 1 week prior.    Jeoffrey GORMAN Barrio, FNP [facility]

## 2024-02-09 ENCOUNTER — Other Ambulatory Visit: Payer: Self-pay | Admitting: Family Medicine

## 2024-02-09 ENCOUNTER — Other Ambulatory Visit: Payer: Self-pay

## 2024-02-10 ENCOUNTER — Other Ambulatory Visit: Payer: Self-pay

## 2024-02-10 MED FILL — Lisinopril & Hydrochlorothiazide Tab 20-12.5 MG: ORAL | 30 days supply | Qty: 30 | Fill #0 | Status: AC

## 2024-02-10 MED FILL — Lisinopril & Hydrochlorothiazide Tab 20-12.5 MG: ORAL | 60 days supply | Qty: 60 | Fill #0 | Status: AC

## 2024-02-11 ENCOUNTER — Other Ambulatory Visit: Payer: Self-pay

## 2024-02-16 ENCOUNTER — Other Ambulatory Visit: Payer: Self-pay

## 2024-02-17 ENCOUNTER — Other Ambulatory Visit: Payer: Self-pay | Admitting: Family Medicine

## 2024-02-17 ENCOUNTER — Encounter: Payer: Self-pay | Admitting: Family Medicine

## 2024-02-17 DIAGNOSIS — Z1231 Encounter for screening mammogram for malignant neoplasm of breast: Secondary | ICD-10-CM

## 2024-03-02 ENCOUNTER — Encounter: Payer: Self-pay | Admitting: Family Medicine

## 2024-03-02 ENCOUNTER — Other Ambulatory Visit: Payer: Self-pay

## 2024-03-14 ENCOUNTER — Encounter: Admitting: Family Medicine

## 2024-03-22 ENCOUNTER — Encounter

## 2024-04-09 ENCOUNTER — Other Ambulatory Visit: Payer: Self-pay | Admitting: Family Medicine

## 2024-04-09 MED FILL — Lisinopril & Hydrochlorothiazide Tab 20-12.5 MG: ORAL | 90 days supply | Qty: 90 | Fill #1 | Status: CN

## 2024-04-10 ENCOUNTER — Other Ambulatory Visit: Payer: Self-pay

## 2024-04-11 ENCOUNTER — Other Ambulatory Visit: Payer: Self-pay

## 2024-04-11 MED ORDER — DULOXETINE HCL 20 MG PO CPEP
40.0000 mg | ORAL_CAPSULE | Freq: Every day | ORAL | 0 refills | Status: AC
  Filled 2024-04-11: qty 180, 90d supply, fill #0

## 2024-04-13 ENCOUNTER — Other Ambulatory Visit: Payer: Self-pay

## 2024-08-02 ENCOUNTER — Other Ambulatory Visit

## 2024-08-09 ENCOUNTER — Ambulatory Visit: Admitting: Family Medicine

## 2025-02-07 ENCOUNTER — Other Ambulatory Visit

## 2025-02-14 ENCOUNTER — Encounter: Admitting: Family Medicine
